# Patient Record
Sex: Female | Born: 2000 | Hispanic: Yes | Marital: Single | Smoking: Current every day smoker
Health system: Southern US, Community
[De-identification: ages and names within clinical notes are randomized; demographics above are authoritative.]

## PROBLEM LIST (undated history)

## (undated) ENCOUNTER — Emergency Department (HOSPITAL_COMMUNITY): Admission: EM | Payer: No Typology Code available for payment source

## (undated) DIAGNOSIS — B999 Unspecified infectious disease: Secondary | ICD-10-CM

## (undated) DIAGNOSIS — Z3490 Encounter for supervision of normal pregnancy, unspecified, unspecified trimester: Secondary | ICD-10-CM

## (undated) DIAGNOSIS — Z789 Other specified health status: Secondary | ICD-10-CM

## (undated) HISTORY — PX: NO PAST SURGERIES: SHX2092

---
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
## 2018-02-27 ENCOUNTER — Ambulatory Visit (HOSPITAL_COMMUNITY)
Admission: EM | Admit: 2018-02-27 | Discharge: 2018-02-27 | Disposition: A | Discharge status: Home / Self Care | Payer: Self-pay

## 2018-02-27 ENCOUNTER — Encounter (HOSPITAL_COMMUNITY): Payer: Self-pay

## 2018-02-27 ENCOUNTER — Emergency Department (HOSPITAL_COMMUNITY)
Admission: EM | Admit: 2018-02-27 | Discharge: 2018-02-27 | Disposition: A | Discharge status: Home / Self Care | Payer: Self-pay | Attending: Emergency Medicine | Admitting: Emergency Medicine

## 2018-02-27 ENCOUNTER — Other Ambulatory Visit: Payer: Self-pay

## 2018-02-27 ENCOUNTER — Emergency Department (HOSPITAL_COMMUNITY): Payer: Self-pay

## 2018-02-27 DIAGNOSIS — O26891 Other specified pregnancy related conditions, first trimester: Secondary | ICD-10-CM | POA: Insufficient documentation

## 2018-02-27 DIAGNOSIS — Z87891 Personal history of nicotine dependence: Secondary | ICD-10-CM | POA: Insufficient documentation

## 2018-02-27 DIAGNOSIS — Z3401 Encounter for supervision of normal first pregnancy, first trimester: Secondary | ICD-10-CM | POA: Insufficient documentation

## 2018-02-27 DIAGNOSIS — Z3A01 Less than 8 weeks gestation of pregnancy: Secondary | ICD-10-CM | POA: Insufficient documentation

## 2018-02-27 DIAGNOSIS — R1084 Generalized abdominal pain: Secondary | ICD-10-CM | POA: Insufficient documentation

## 2018-02-27 DIAGNOSIS — Z79899 Other long term (current) drug therapy: Secondary | ICD-10-CM | POA: Insufficient documentation

## 2018-02-27 LAB — URINALYSIS, ROUTINE W REFLEX MICROSCOPIC
Bilirubin Urine: NEGATIVE
Glucose, UA: NEGATIVE mg/dL
Hgb urine dipstick: NEGATIVE
Ketones, ur: 80 mg/dL — AB
Nitrite: NEGATIVE
Protein, ur: 30 mg/dL — AB
Specific Gravity, Urine: 1.033 — ABNORMAL HIGH (ref 1.005–1.030)
pH: 5 (ref 5.0–8.0)

## 2018-02-27 LAB — CBC WITH DIFFERENTIAL/PLATELET
Abs Immature Granulocytes: 0 10*3/uL (ref 0.0–0.1)
Basophils Absolute: 0 10*3/uL (ref 0.0–0.1)
Basophils Relative: 1 %
Eosinophils Absolute: 0 10*3/uL (ref 0.0–0.7)
Eosinophils Relative: 0 %
HCT: 44.5 % (ref 36.0–46.0)
Hemoglobin: 14.7 g/dL (ref 12.0–15.0)
Immature Granulocytes: 0 %
Lymphocytes Relative: 18 %
Lymphs Abs: 1 10*3/uL (ref 0.7–4.0)
MCH: 31.5 pg (ref 26.0–34.0)
MCHC: 33 g/dL (ref 30.0–36.0)
MCV: 95.5 fL (ref 78.0–100.0)
Monocytes Absolute: 0.5 10*3/uL (ref 0.1–1.0)
Monocytes Relative: 9 %
Neutro Abs: 3.9 10*3/uL (ref 1.7–7.7)
Neutrophils Relative %: 72 %
Platelets: 177 10*3/uL (ref 150–400)
RBC: 4.66 MIL/uL (ref 3.87–5.11)
RDW: 12.3 % (ref 11.5–15.5)
WBC: 5.4 10*3/uL (ref 4.0–10.5)

## 2018-02-27 LAB — COMPREHENSIVE METABOLIC PANEL
ALT: 14 U/L (ref 14–54)
AST: 17 U/L (ref 15–41)
Albumin: 4.4 g/dL (ref 3.5–5.0)
Alkaline Phosphatase: 50 U/L (ref 38–126)
Anion gap: 10 (ref 5–15)
BUN: 8 mg/dL (ref 6–20)
CO2: 23 mmol/L (ref 22–32)
Calcium: 9.4 mg/dL (ref 8.9–10.3)
Chloride: 105 mmol/L (ref 101–111)
Creatinine, Ser: 0.72 mg/dL (ref 0.44–1.00)
GFR calc Af Amer: >60 mL/min (ref >60)
GFR calc non Af Amer: >60 mL/min (ref >60)
Glucose, Bld: 99 mg/dL (ref 65–99)
Potassium: 3.5 mmol/L (ref 3.5–5.1)
Sodium: 138 mmol/L (ref 135–145)
Total Bilirubin: 0.8 mg/dL (ref 0.3–1.2)
Total Protein: 7.2 g/dL (ref 6.5–8.1)

## 2018-02-27 LAB — HCG, QUANTITATIVE, PREGNANCY: hCG, Beta Chain, Quant, S: 66467 m[IU]/mL — ABNORMAL HIGH (ref <5)

## 2018-02-27 LAB — LIPASE, BLOOD: Lipase: 38 U/L (ref 11–51)

## 2018-02-27 LAB — POC URINE PREG, ED: Preg Test, Ur: POSITIVE — AB

## 2018-02-27 NOTE — Discharge Instructions (Addendum)
Please read attached information. If you experience any new or worsening signs or symptoms please return to the emergency room for evaluation. Please follow-up with your primary care provider or specialist as discussed.  °

## 2018-02-27 NOTE — ED Triage Notes (Signed)
Pt states that she took a pregnancy test at home that was positive. Pt reports she is wanting to get an abortion and here "for a pill that will help".

## 2018-02-27 NOTE — ED Provider Notes (Signed)
[facility] EMERGENCY DEPARTMENT Provider Note   CSN: [id number] Arrival date & time: 02/27/18  1625     History   Chief Complaint Chief Complaint  Patient presents with  . Possible Pregnancy    HPI Kristin Bates is a 17 y.o. female.  HPI   17 year old female presents today with complaints of abdominal pain nausea vomiting and reported pregnancy.  Patient notes over the last 3 days she has had generalized abdominal pain, nonfocal with associated nausea and vomiting.  Patient notes that the symptoms feel similar to episode when she is pregnant in the past.  She notes a positive pregnancy test at home.  She denies any vaginal bleeding or discharge.  Fever.  She is able to tolerate p.o.  Past Medical History:  Diagnosis Date  . Medical history non-contributory   . Pregnant     There are no active problems to display for this patient.   Past Surgical History:  Procedure Laterality Date  . NO PAST SURGERIES       OB History    Gravida  1   Para  1   Term  1   Preterm      AB      Living  1     SAB      TAB      Ectopic      Multiple  0   Live Births  1            Home Medications    Prior to Admission medications   Medication Sig Start Date End Date Taking? Authorizing Provider  ibuprofen (ADVIL,MOTRIN) 600 MG tablet Take 1 tablet (600 mg total) by mouth every 6 (six) hours. 07/01/16   Leland Her, DO  senna-docusate (SENOKOT-S) 8.6-50 MG tablet Take 2 tablets by mouth daily. 07/02/16   Leland Her, DO    Family History Family History  Problem Relation Age of Onset  . Heart disease Father 35       Heart attack    Social History Social History   Tobacco Use  . Smoking status: Former Smoker    Last attempt to quit: 09/09/2015    Years since quitting: 2.4  . Smokeless tobacco: Never Used  Substance Use Topics  . Alcohol use: Yes    Comment: social   . Drug use: No    Types: Marijuana    Comment: Prior to pregnancy       Allergies   Patient has no known allergies.   Review of Systems Review of Systems  All other systems reviewed and are negative.   Physical Exam Updated Vital Signs BP 103/73 (BP Location: Right Arm)   Pulse 70   Temp 98.5 F (36.9 C) (Oral)   Resp 16   Ht  (1.6 m)   Wt 72.6 kg (160 lb)   LMP 01/20/2018   SpO2 99%   BMI 28.34 kg/m   Physical Exam  Constitutional: She is oriented to person, place, and time. She appears well-developed and well-nourished.  HENT:  Head: Normocephalic and atraumatic.  Eyes: Pupils are equal, round, and reactive to light. Conjunctivae are normal. Right eye exhibits no discharge. Left eye exhibits no discharge. No scleral icterus.  Neck: Normal range of motion. No JVD present. No tracheal deviation present.  Pulmonary/Chest: Effort normal. No stridor.  Abdominal: Soft. She exhibits no distension and no mass. There is tenderness. There is no rebound and no guarding. No hernia.  Minor tenderness  palpation bilateral upper quadrants, no tenderness palpation of lower abdomen or pelvis, no bounce guarding or masses  Neurological: She is alert and oriented to person, place, and time. Coordination normal.  Psychiatric: She has a normal mood and affect. Her behavior is normal. Judgment and thought content normal.  Nursing note and vitals reviewed.    ED Treatments / Results  Labs (all labs ordered are listed, but only abnormal results are displayed) Labs Reviewed  URINALYSIS, ROUTINE W REFLEX MICROSCOPIC - Abnormal; Notable for the following components:      Result Value   Color, Urine AMBER (*)    APPearance HAZY (*)    Specific Gravity, Urine 1.033 (*)    Ketones, ur 80 (*)    Protein, ur 30 (*)    Leukocytes, UA TRACE (*)    Bacteria, UA RARE (*)    All other components within normal limits  HCG, QUANTITATIVE, PREGNANCY - Abnormal; Notable for the following components:   hCG, Beta Chain, Quant, [place] 16,109 (*)    All other  components within normal limits  POC URINE PREG, ED - Abnormal; Notable for the following components:   Preg Test, Ur POSITIVE (*)    All other components within normal limits  CBC WITH DIFFERENTIAL/PLATELET  COMPREHENSIVE METABOLIC PANEL  LIPASE, BLOOD    EKG None  Radiology US Ob Comp Less 14 Wks  Result Date: 02/27/2018 CLINICAL DATA:  Pregnant patient in first-trimester pregnancy with pelvic pain. EXAM: OBSTETRIC <14 WK [place] AND TRANSVAGINAL OB US TECHNIQUE: Both transabdominal and transvaginal ultrasound examinations were performed for complete evaluation of the gestation as well as the maternal uterus, adnexal regions, and pelvic cul-de-sac. Transvaginal technique was performed to assess early pregnancy. COMPARISON:  None. FINDINGS: Intrauterine gestational sac: Single Yolk sac:  Visualized. Embryo:  Visualized. Cardiac Activity: Visualized. Heart Rate: 113 bpm CRL:  6.7 mm   6 w   4 d                  [place] EDC: 10/19/2018 Subchorionic hemorrhage:  Small measuring 1.9 x 1.2 x 1.6 cm. Maternal uterus/adnexae: Both ovaries are visualized and are normal. No adnexal mass. No pelvic free fluid. IMPRESSION: Single live intrauterine pregnancy estimated gestational age 102 weeks 4 days based on crown-rump length for estimated date of delivery 10/19/2018. Small subchorionic hemorrhage. Electronically Signed   By: Rubye Oaks M.D.   On: 02/27/2018 21:38   US Ob Transvaginal  Result Date: 02/27/2018 CLINICAL DATA:  Pregnant patient in first-trimester pregnancy with pelvic pain. EXAM: OBSTETRIC <14 WK [place] AND TRANSVAGINAL OB US TECHNIQUE: Both transabdominal and transvaginal ultrasound examinations were performed for complete evaluation of the gestation as well as the maternal uterus, adnexal regions, and pelvic cul-de-sac. Transvaginal technique was performed to assess early pregnancy. COMPARISON:  None. FINDINGS: Intrauterine gestational sac: Single Yolk sac:  Visualized. Embryo:  Visualized. Cardiac  Activity: Visualized. Heart Rate: 113 bpm CRL:  6.7 mm   6 w   4 d                  [place] EDC: 10/19/2018 Subchorionic hemorrhage:  Small measuring 1.9 x 1.2 x 1.6 cm. Maternal uterus/adnexae: Both ovaries are visualized and are normal. No adnexal mass. No pelvic free fluid. IMPRESSION: Single live intrauterine pregnancy estimated gestational age 102 weeks 4 days based on crown-rump length for estimated date of delivery 10/19/2018. Small subchorionic hemorrhage. Electronically Signed   By: Rubye Oaks M.D.   On: 02/27/2018 21:38  Procedures Procedures (including critical care time)  Medications Ordered in ED Medications - No data to display   Initial Impression / Assessment and Plan / ED Course  I have reviewed the triage vital signs and the nursing notes.  Pertinent labs & imaging results that were available during my care of the patient were reviewed by me and considered in my medical decision making (see chart for details).     Final Clinical Impressions(s) / ED Diagnoses   Final diagnoses:  Less than [redacted] weeks gestation of pregnancy   Labs: CBC, CMP, hCG, lipase, point-of-care urine pregnancy test  Imaging: Ultrasound OB transvaginal  Consults:  Therapeutics:  Discharge Meds:   Assessment/Plan: 17 year old female presents today with complaints of lower abdominal pain and pregnancy.  She notes this is similar to previous pregnancies.  She has a single live intrauterine pregnancy, small subchorionic hemorrhage noted.  Patient well-appearing in no acute distress, she has no nausea or vomiting here and does not want any medications here.  Patient is requesting assistance with abortion, she referred to the women's clinic.  She is given strict return precautions, she verbalized understanding and agreement to today's plan had no further questions or concerns      ED Discharge Orders    None       Rosalio Loud 02/28/18 2010    Eber Hong, MD 03/03/18 [phone]

## 2019-03-21 ENCOUNTER — Inpatient Hospital Stay (HOSPITAL_COMMUNITY)
Admission: AD | Admit: 2019-03-21 | Discharge: 2019-03-21 | Disposition: A | Discharge status: Home / Self Care | Payer: Self-pay | Attending: Obstetrics and Gynecology | Admitting: Obstetrics and Gynecology

## 2019-03-21 ENCOUNTER — Emergency Department (HOSPITAL_COMMUNITY)
Admission: EM | Admit: 2019-03-21 | Discharge: 2019-03-21 | Disposition: A | Discharge status: Home / Self Care | Payer: Self-pay | Attending: Emergency Medicine | Admitting: Emergency Medicine

## 2019-03-21 ENCOUNTER — Encounter (HOSPITAL_COMMUNITY): Payer: Self-pay | Admitting: Emergency Medicine

## 2019-03-21 ENCOUNTER — Other Ambulatory Visit: Payer: Self-pay

## 2019-03-21 DIAGNOSIS — F17210 Nicotine dependence, cigarettes, uncomplicated: Secondary | ICD-10-CM | POA: Insufficient documentation

## 2019-03-21 DIAGNOSIS — N760 Acute vaginitis: Secondary | ICD-10-CM | POA: Insufficient documentation

## 2019-03-21 DIAGNOSIS — Z3202 Encounter for pregnancy test, result negative: Secondary | ICD-10-CM | POA: Insufficient documentation

## 2019-03-21 DIAGNOSIS — B9689 Other specified bacterial agents as the cause of diseases classified elsewhere: Secondary | ICD-10-CM | POA: Insufficient documentation

## 2019-03-21 LAB — URINALYSIS, ROUTINE W REFLEX MICROSCOPIC
Bilirubin Urine: NEGATIVE
Glucose, UA: NEGATIVE mg/dL
Hgb urine dipstick: NEGATIVE
Ketones, ur: NEGATIVE mg/dL
Leukocytes,Ua: NEGATIVE
Nitrite: NEGATIVE
Protein, ur: NEGATIVE mg/dL
Specific Gravity, Urine: 1.009 (ref 1.005–1.030)
pH: 7 (ref 5.0–8.0)

## 2019-03-21 LAB — WET PREP, GENITAL
Sperm: NONE SEEN
Trich, Wet Prep: NONE SEEN
Yeast Wet Prep HPF POC: NONE SEEN

## 2019-03-21 LAB — POCT PREGNANCY, URINE
Preg Test, Ur: NEGATIVE
Preg Test, Ur: NEGATIVE

## 2019-03-21 MED ORDER — METRONIDAZOLE 500 MG PO TABS: 500.0000 mg | ORAL_TABLET | Freq: Two times a day (BID) | ORAL | 0 refills | Status: DC

## 2019-03-21 MED ORDER — CEFTRIAXONE SODIUM 250 MG IJ SOLR
250.0000 mg | Freq: Once | INTRAMUSCULAR | Status: AC
  Administered 2019-03-21: 250 mg via INTRAMUSCULAR
  Filled 2019-03-21: qty 250

## 2019-03-21 MED ORDER — LIDOCAINE HCL (PF) 1 % IJ SOLN
5.0000 mL | Freq: Once | INTRAMUSCULAR | Status: AC
  Administered 2019-03-21: 5 mL
  Filled 2019-03-21: qty 5

## 2019-03-21 MED ORDER — AZITHROMYCIN 250 MG PO TABS
1000.0000 mg | ORAL_TABLET | Freq: Once | ORAL | Status: AC
  Administered 2019-03-21: 1000 mg via ORAL
  Filled 2019-03-21: qty 4

## 2019-03-21 NOTE — MAU Provider Note (Signed)
First Provider Initiated Contact with Patient 03/21/19 1843      S Ms. Marguarite Markov is a 18 y.o. G53P1001 non-pregnant female who presents to MAU today with complaint of possible vaginal laceration.   O BP 113/70 (BP Location: Right Arm)   Pulse 82   Temp 98.2 F (36.8 C)   Resp 16   Wt 69.5 kg   LMP 02/16/2019   SpO2 100%   BMI 27.12 kg/m  Physical Exam  Constitutional: She is oriented to person, place, and time. She appears well-developed and well-nourished. No distress.  HENT:  Head: Normocephalic and atraumatic.  Respiratory: Effort normal.  Neurological: She is alert and oriented to person, place, and time.  Skin: She is not diaphoretic.  Psychiatric: She has a normal mood and affect. Her behavior is normal. Judgment and thought content normal.   A Non pregnant female Medical screening exam complete  P Discharge from MAU in stable condition Patient given the option of transfer to [facility] for further evaluation or seek care in outpatient facility of choice List of options for follow-up given  Warning signs for worsening condition that would warrant emergency follow-up discussed Patient may return to MAU as needed for pregnancy related complaints  Nugent, Gerrie Nordmann, NP 03/21/2019 6:48 PM

## 2019-03-21 NOTE — ED Provider Notes (Signed)
[facility] EMERGENCY DEPARTMENT Provider Note   CSN: [id number] Arrival date & time: 03/21/19  1853    History   Chief Complaint Chief Complaint  Patient presents with  . Vaginal Pain    HPI Kristin Bates is a 18 y.o. female, G1, 45P1, presenting to the emergency department from the MAU with complaint of vaginal pain that began on Sunday.  Patient states she has intermittent pain that is sometimes worse with movement.  She states she initially had pain with urination, however has not had recurrence of this.  She had a little bit of spotting with wiping after urinating, however no frank vaginal bleeding.  She states the pain feels similar to the pain associated with a vaginal tear during childbirth.  LMP 02/16/2019, states she is due for her period soon.  She does endorse vaginal discharge.  She is sexually active with one female partner without protection.  She had pregnancy test done at MAU today which was negative. No assoc abdominal pain.     The history is provided by the patient and medical records.    Past Medical History:  Diagnosis Date  . Medical history non-contributory   . Pregnant     There are no active problems to display for this patient.   Past Surgical History:  Procedure Laterality Date  . NO PAST SURGERIES       OB History    Gravida  1   Para  1   Term  1   Preterm      AB      Living  1     SAB      TAB      Ectopic      Multiple  0   Live Births  1            Home Medications    Prior to Admission medications   Medication Sig Start Date End Date Taking? Authorizing Provider  ibuprofen (ADVIL,MOTRIN) 600 MG tablet Take 1 tablet (600 mg total) by mouth every 6 (six) hours. 07/01/16   Leland HerYoo, Elsia J, DO  senna-docusate (SENOKOT-S) 8.6-50 MG tablet Take 2 tablets by mouth daily. 07/02/16   Leland HerYoo, Elsia J, DO    Family History Family History  Problem Relation Age of Onset  . Heart disease Father 4438       Heart  attack    Social History Social History   Tobacco Use  . Smoking status: Former Smoker    Quit date: 09/09/2015    Years since quitting: 3.5  . Smokeless tobacco: Never Used  Substance Use Topics  . Alcohol use: Yes    Comment: social   . Drug use: No    Types: Marijuana    Comment: Prior to pregnancy      Allergies   Patient has no known allergies.   Review of Systems Review of Systems  Constitutional: Negative for fever.  Gastrointestinal: Negative for abdominal pain.  Genitourinary: Positive for vaginal bleeding, vaginal discharge and vaginal pain. Negative for dysuria and frequency.  All other systems reviewed and are negative.    Physical Exam Updated Vital Signs BP 101/72   Pulse 75   Temp 98.2 F (36.8 C) (Oral)   Resp 18   LMP 02/16/2019 (Exact Date)   SpO2 100%   Physical Exam Vitals signs and nursing note reviewed. Exam conducted with a chaperone present.  Constitutional:      General: She is not in acute distress.  Appearance: She is well-developed.  HENT:     Head: Normocephalic and atraumatic.  Eyes:     Conjunctiva/sclera: Conjunctivae normal.  Cardiovascular:     Rate and Rhythm: Normal rate.  Pulmonary:     Effort: Pulmonary effort is normal.  Abdominal:     General: Bowel sounds are normal.     Palpations: Abdomen is soft.     Tenderness: There is no abdominal tenderness.  Genitourinary:    General: Normal vulva.     Pubic Area: No rash.      Labia:        Right: No rash or tenderness.        Left: No rash or tenderness.      Urethra: No urethral swelling.     Cervix: No cervical bleeding.     Uterus: Normal. Not enlarged and not tender.      Adnexa: Right adnexa normal and left adnexa normal.       Right: No mass or tenderness.         Left: No mass or tenderness.       Comments: Yellow/grayish discharge present.  There is some vaginal erythema, however no obvious laceration.  There is tenderness inside of the vagina just  deep to the urethra on the anterior vaginal wall.  No obvious abnormality on palpation.  No CMT or adnexal tenderness. Skin:    General: Skin is warm.  Neurological:     Mental Status: She is alert.  Psychiatric:        Behavior: Behavior normal.      ED Treatments / Results  Labs (all labs ordered are listed, but only abnormal results are displayed) Labs Reviewed  WET PREP, GENITAL - Abnormal; Notable for the following components:      Result Value   Clue Cells Wet Prep HPF POC PRESENT (*)    WBC, Wet Prep HPF POC MANY (*)    All other components within normal limits  URINALYSIS, ROUTINE W REFLEX MICROSCOPIC  HIV ANTIBODY (ROUTINE TESTING W REFLEX)  RPR  GC/CHLAMYDIA PROBE AMP ([place]) NOT AT [facility]    EKG None  Radiology No results found.  Procedures Procedures (including critical care time)  Medications Ordered in ED Medications  cefTRIAXone (ROCEPHIN) injection 250 mg (has no administration in time range)  azithromycin (ZITHROMAX) tablet 1,000 mg (has no administration in time range)  lidocaine (PF) (XYLOCAINE) 1 % injection 5 mL (has no administration in time range)     Initial Impression / Assessment and Plan / ED Course  I have reviewed the triage vital signs and the nursing notes.  Pertinent labs & imaging results that were available during my care of the patient were reviewed by me and considered in my medical decision making (see chart for details).        Patient presenting with vaginal pain intermittently since Sunday.  Patient had a small of spotting, however is due for her period soon.  Negative pregnancy test at MAU today.  She is currently sexually active with female partner without protection.  Endorses some discharge.  No urinary symptoms except for one episode of dysuria beginning of the week.  On exam, abdomen is benign.  Pelvic reveals abnormal discharge with some localized tenderness to the anterior vaginal wall, however no obvious laceration  is visualized.  Wet prep with many white cells and clue cells.  STD cultures are sent.  Given patient's exam, will treat with Flagyl for BV, and treat prophylactically with  azithromycin and Rocephin.  No suspicion for PID, no CMT or adnexal tenderness, no abdominal complaints or systemic complaints.  Vital signs are stable, afebrile.  Patient is aware she is pending STD cultures and instructed to avoid any intercourse until she knows all of her results.  GYN referral provided for follow-up.  Safe for discharge.  Discussed results, findings, treatment and follow up. Patient advised of return precautions. Patient verbalized understanding and agreed with plan.   Final Clinical Impressions(s) / ED Diagnoses   Final diagnoses:  BV (bacterial vaginosis)    ED Discharge Orders    None       Baily Serpe, [place] N, PA-C 03/21/19 2157    Little, Ambrose Finlandachel Morgan, MD 03/22/19 0008

## 2019-03-21 NOTE — MAU Note (Addendum)
Pt states that she feels like she has a vaginal tear, but is not having any bleeding.   Pt reports reports vaginal pain with movement.   Pt reports some vaginal bleeding 3 days ago.   Pt also reports burning with urination for 4-5 days but stopped today.

## 2019-03-21 NOTE — Discharge Instructions (Signed)
Please read the instructions below.  Talk with your primary care provider about any new medications.  Please schedule an appointment for follow up with your OBGYN or primary care.  You have been treated today for gonorrhea and chlamydia. Finish your antibiotic (Flagyl/Metronidazole) as prescribed. Do not drink alcohol with this medication as it will cause vomiting.  You will receive a call from the hospital if your test results come back positive. Avoid all sexual activity until you know your test results. If your results come back positive, it is important that you inform all of your sexual partners.  Return to the ER for high fever, severe abdominal pain, or new or worsening symptoms.

## 2019-03-21 NOTE — ED Triage Notes (Signed)
Patient reports sudden onset of vaginal bleeding that only lasted for a few minutes on Sunday, followed by pelvic pain that has persisted. LMP 02/16/19 - sent over from [facility] center because of a negative pregnancy test.

## 2019-03-22 LAB — RPR: RPR Ser Ql: NONREACTIVE

## 2019-03-22 LAB — HIV ANTIBODY (ROUTINE TESTING W REFLEX): HIV Screen 4th Generation wRfx: NONREACTIVE

## 2019-03-22 LAB — GC/CHLAMYDIA PROBE AMP (~~LOC~~) NOT AT ARMC
Chlamydia: POSITIVE — AB
Neisseria Gonorrhea: NEGATIVE

## 2019-04-19 ENCOUNTER — Other Ambulatory Visit: Payer: Self-pay

## 2019-04-19 ENCOUNTER — Emergency Department (HOSPITAL_COMMUNITY): Payer: Self-pay

## 2019-04-19 ENCOUNTER — Emergency Department (HOSPITAL_COMMUNITY)
Admission: EM | Admit: 2019-04-19 | Discharge: 2019-04-19 | Disposition: A | Discharge status: Home / Self Care | Payer: Self-pay | Attending: Emergency Medicine | Admitting: Emergency Medicine

## 2019-04-19 DIAGNOSIS — Y929 Unspecified place or not applicable: Secondary | ICD-10-CM | POA: Insufficient documentation

## 2019-04-19 DIAGNOSIS — M25571 Pain in right ankle and joints of right foot: Secondary | ICD-10-CM | POA: Insufficient documentation

## 2019-04-19 DIAGNOSIS — Y9301 Activity, walking, marching and hiking: Secondary | ICD-10-CM | POA: Insufficient documentation

## 2019-04-19 DIAGNOSIS — Z79899 Other long term (current) drug therapy: Secondary | ICD-10-CM | POA: Insufficient documentation

## 2019-04-19 DIAGNOSIS — Z87891 Personal history of nicotine dependence: Secondary | ICD-10-CM | POA: Insufficient documentation

## 2019-04-19 DIAGNOSIS — Y999 Unspecified external cause status: Secondary | ICD-10-CM | POA: Insufficient documentation

## 2019-04-19 DIAGNOSIS — S80211A Abrasion, right knee, initial encounter: Secondary | ICD-10-CM | POA: Insufficient documentation

## 2019-04-19 DIAGNOSIS — W010XXA Fall on same level from slipping, tripping and stumbling without subsequent striking against object, initial encounter: Secondary | ICD-10-CM | POA: Insufficient documentation

## 2019-04-19 DIAGNOSIS — S80212A Abrasion, left knee, initial encounter: Secondary | ICD-10-CM | POA: Insufficient documentation

## 2019-04-19 MED ORDER — DOUBLE ANTIBIOTIC 500-10000 UNIT/GM EX OINT
TOPICAL_OINTMENT | Freq: Two times a day (BID) | CUTANEOUS | Status: DC
  Filled 2019-04-19: qty 28.4

## 2019-04-19 MED ORDER — HYDROCODONE-ACETAMINOPHEN 5-325 MG PO TABS
1.0000 | ORAL_TABLET | Freq: Once | ORAL | Status: AC
  Administered 2019-04-19: 1 via ORAL
  Filled 2019-04-19: qty 1

## 2019-04-19 NOTE — Progress Notes (Signed)
Orthopedic Tech Progress Note Patient Details:  Kristin Bates 12/02/2000 [id number]  Ortho Devices Type of Ortho Device: Ace wrap, Crutches Ortho Device/Splint Interventions: Application   Post Interventions Patient Tolerated: Well Instructions Provided: Care of device   [place] Pink 04/19/2019, 9:51 AM

## 2019-04-19 NOTE — ED Triage Notes (Signed)
Pt fell last night and twisted her right ankle. Abrasions to both knees. Pt did not hit her head or LOC

## 2019-04-19 NOTE — ED Notes (Signed)
Patient transported to X-ray 

## 2019-04-19 NOTE — ED Provider Notes (Signed)
[facility]   CSN: [id number] Arrival date & time: 04/19/19  0807    History   Chief Complaint Chief Complaint  Patient presents with  . Ankle Pain    HPI Kristin Bates is a 18 y.o. female who presents today for evaluation of right ankle and bilateral knee pain.  She reports that last night she was wearing different shoes when she misstepped causing her to roll her right ankle.  She reports immediate onset of pain in the right ankle.  She denies striking her head or passing out.  She states that she scraped both of her knees.  She denies any other injuries.  She denies possibility of pregnancy.  She reports significant pain when she attempts to bear weight on the ankle.  No interventions tried prior to arrival.  She does report that she cleaned both of her knees with Betadine last night.  She denies any possibility of pregnancy.     HPI  Past Medical History:  Diagnosis Date  . Medical history non-contributory   . Pregnant     There are no active problems to display for this patient.   Past Surgical History:  Procedure Laterality Date  . NO PAST SURGERIES       OB History    Gravida  1   Para  1   Term  1   Preterm      AB      Living  1     SAB      TAB      Ectopic      Multiple  0   Live Births  1            Home Medications    Prior to Admission medications   Medication Sig Start Date End Date Taking? Authorizing Provider  ibuprofen (ADVIL,MOTRIN) 600 MG tablet Take 1 tablet (600 mg total) by mouth every 6 (six) hours. 07/01/16   Leland HerYoo, Elsia J, DO  metroNIDAZOLE (FLAGYL) 500 MG tablet Take 1 tablet (500 mg total) by mouth 2 (two) times daily. 03/21/19   Robinson, [place] N, PA-C  senna-docusate (SENOKOT-S) 8.6-50 MG tablet Take 2 tablets by mouth daily. 07/02/16   Leland HerYoo, Elsia J, DO    Family History Family History  Problem Relation Age of Onset  . Heart disease Father 4238       Heart attack     Social History Social History   Tobacco Use  . Smoking status: Former Smoker    Quit date: 09/09/2015    Years since quitting: 3.6  . Smokeless tobacco: Never Used  Substance Use Topics  . Alcohol use: Yes    Comment: social   . Drug use: No    Types: Marijuana    Comment: Prior to pregnancy      Allergies   Patient has no known allergies.   Review of Systems Review of Systems  Constitutional: Negative for chills and fever.  Musculoskeletal:       Right ankle pain and swelling  Skin: Positive for wound (Abrasions to bilateral anterior knees).  Neurological: Negative for syncope and headaches.  All other systems reviewed and are negative.    Physical Exam Updated Vital Signs BP 103/61 (BP Location: Right Arm)   Pulse 85   Temp 98.6 F (37 C) (Oral)   Resp 16   LMP 04/12/2019 (Exact Date)   SpO2 100%   Physical Exam Vitals signs and nursing note reviewed.  Constitutional:      General: She is not in acute distress.    Appearance: She is not ill-appearing.  HENT:     Head: Normocephalic.  Cardiovascular:     Rate and Rhythm: Normal rate.     Pulses: Normal pulses.     Comments: 2+ DP pulses bilaterally.  Right PT pulses not palpated secondary to edema and pain. Pulmonary:     Effort: Pulmonary effort is normal. No respiratory distress.  Musculoskeletal:     Comments: There is significant edema along the lateral aspect of the right ankle.  No crepitus palpated.  The right ankle is significantly tender to palpation primarily along the lateral aspect along with when pressure is applied to the bottom of the foot.  There is no tenderness to palpation along the foot, rather referred pain into the ankle.  No tenderness to palpation over proximal right lower leg.  Bilateral knees have full range of motion with mild pain during flexion along the abrasions anteriorly.  Skin:    Comments: There are abrasions to the anterior bilateral knees without evidence of  surrounding erythema, fluctuance, induration or secondary infection.  Mild serous drainage from abrasions bilaterally.    Neurological:     Mental Status: She is alert.      ED Treatments / Results  Labs (all labs ordered are listed, but only abnormal results are displayed) Labs Reviewed - No data to display  EKG None  Radiology Dg Ankle Complete Right  Result Date: 04/19/2019 CLINICAL DATA:  Pain and swelling following fall EXAM: RIGHT ANKLE - COMPLETE 3+ VIEW COMPARISON:  None. FINDINGS: Frontal, oblique, and lateral views were obtained. There is soft tissue swelling laterally. There is no demonstrable fracture or joint effusion. There is no joint space narrowing or erosion. Ankle mortise appears intact. IMPRESSION: Soft tissue swelling laterally. No evident fracture or arthropathic change. Ankle mortise appears intact. Electronically Signed   By: Bretta BangWilliam  Woodruff III M.D.   On: 04/19/2019 08:45    Procedures Procedures (including critical care time)  Medications Ordered in ED Medications  polymixin-bacitracin (POLYSPORIN) ointment (has no administration in time range)  HYDROcodone-acetaminophen (NORCO/VICODIN) 5-325 MG per tablet 1 tablet (has no administration in time range)     Initial Impression / Assessment and Plan / ED Course  I have reviewed the triage vital signs and the nursing notes.  Pertinent labs & imaging results that were available during my care of the patient were reviewed by me and considered in my medical decision making (see chart for details).       Kristin Bates Presents with right ankle pain after a mechanical trip causing her to roll her right ankle consistent with an ankle sprain/strain.  The affected ankle has mild edema and is tender on the lateral aspect.  X-rays were obtained with out acute abnormality. The skin is intact to ankle/foot.  The foot is warm and well perfused with intact sensation.  Motor function is limited secondary to pain.   Patient given instructions for OTC pain medication, she is given a one-time dose of Vicodin while in the emergency room based on the extent of the swelling, Ace wrap, and crutches.  She is given orthopedics follow-up and instructed to follow-up if symptoms do not significantly improve in 1 week.  Stressed the importance of ice and aggressive elevation to help with her pain.   Recommended topical antibiotic ointment of her abrasions on bilateral knees.  General wound care was discussed.  Return precautions were  discussed with patient who states their understanding.  At the time of discharge patient denied any unaddressed complaints or concerns.  Patient is agreeable for discharge home.   Final Clinical Impressions(s) / ED Diagnoses   Final diagnoses:  Acute right ankle pain  Abrasion of knee, bilateral    ED Discharge Orders    None       Ollen Gross 04/19/19 5366    Isla Pence, MD 04/19/19 1000

## 2019-04-19 NOTE — Discharge Instructions (Addendum)
I have given you the information for an orthopedics (bone) doctor.  If your symptoms do not improve significantly in 1 week or you feel you need to be seen again for your ankle please call them and make an appointment.  Your x-rays today did not show any break in the ankle, and as we discussed I suspect you have an ankle sprain/strain.  You are given a dose of Vicodin while in the emergency room.  This may make you drowsy and sleepy, please do not drive, operate heavy machinery, or perform any other tasks that may be dangerous to you or anyone else if you were to make a slow, poor decision or fall asleep.  Please follow these precautions for at least the next 12 hours.  It is very important that you elevate your ankle above your heart.  Please also do ice 20 minutes on 20 minutes off.  The more aggressive you are in the elevation and icing the sooner the pain, especially the throbbing pain, will improve.  Please take Ibuprofen (Advil, motrin) and Tylenol (acetaminophen) to relieve your pain.  You may take up to 600 MG (3 pills) of normal strength ibuprofen every 8 hours as needed.  In between doses of ibuprofen you make take tylenol, up to 1,000 mg (two extra strength pills).  Do not take more than 3,000 mg tylenol in a 24 hour period.  Please check all medication labels as many medications such as pain and cold medications may contain tylenol.  Do not drink alcohol while taking these medications.  Do not take other NSAID'S while taking ibuprofen (such as aleve or naproxen).  Please take ibuprofen with food to decrease stomach upset.

## 2019-07-09 ENCOUNTER — Other Ambulatory Visit: Payer: Self-pay

## 2019-07-09 ENCOUNTER — Encounter (HOSPITAL_COMMUNITY): Payer: Self-pay | Admitting: *Deleted

## 2019-07-09 ENCOUNTER — Inpatient Hospital Stay (HOSPITAL_COMMUNITY)
Admission: AD | Admit: 2019-07-09 | Discharge: 2019-07-09 | Disposition: A | Discharge status: Home / Self Care | Payer: Self-pay | Attending: Obstetrics & Gynecology | Admitting: Obstetrics & Gynecology

## 2019-07-09 DIAGNOSIS — R300 Dysuria: Secondary | ICD-10-CM | POA: Insufficient documentation

## 2019-07-09 DIAGNOSIS — N898 Other specified noninflammatory disorders of vagina: Secondary | ICD-10-CM | POA: Insufficient documentation

## 2019-07-09 DIAGNOSIS — R1030 Lower abdominal pain, unspecified: Secondary | ICD-10-CM | POA: Insufficient documentation

## 2019-07-09 DIAGNOSIS — R109 Unspecified abdominal pain: Secondary | ICD-10-CM

## 2019-07-09 LAB — POCT PREGNANCY, URINE: Preg Test, Ur: NEGATIVE

## 2019-07-09 NOTE — MAU Note (Signed)
Feeling pain in lower abd, cramping.  Started about a wk ago. Hurts when she urinates. D/c has an odor. Has not done a HPT>

## 2019-07-09 NOTE — MAU Provider Note (Signed)
First Provider Initiated Contact with Patient 07/09/19 1208     S Ms. Kristin Bates is a 18 y.o. G12P1001 non-pregnant female who presents to MAU today with complaint of lower abdominal cramping. She endorses dysuria and abnormal vaginal discharge. She reports LMP in early August. She has not taken a HPT.  O BP (!) 102/56 (BP Location: Right Arm)   Pulse (!) 105   Temp 98.4 F (36.9 C) (Oral)   Resp 18   Ht 5\' 4"  (1.626 m)   Wt 70.2 kg   LMP 05/16/2019 (Approximate)   SpO2 98%   BMI 26.57 kg/m  Physical Exam  Nursing note and vitals reviewed. Constitutional: She is oriented to person, place, and time. She appears well-developed and well-nourished.  Cardiovascular: Normal rate.  Respiratory: Effort normal.  GI: Soft.  Neurological: She is alert and oriented to person, place, and time.  Skin: Skin is warm and dry.  Psychiatric: She has a normal mood and affect. Her behavior is normal. Judgment and thought content normal.    A Non pregnant female Medical screening exam complete  P Discharge from MAU in stable condition Patient given the option of transfer to [facility] for further evaluation or seek care in outpatient facility of choice List of options for follow-up given  Warning signs for worsening condition that would warrant emergency follow-up discussed Patient may return to MAU as needed for pregnancy related complaints  Mallie Snooks, CNM 07/09/2019 12:21 PM

## 2019-07-22 ENCOUNTER — Emergency Department (HOSPITAL_COMMUNITY)
Admission: EM | Admit: 2019-07-22 | Discharge: 2019-07-23 | Disposition: A | Discharge status: Home / Self Care | Payer: Self-pay | Attending: Emergency Medicine | Admitting: Emergency Medicine

## 2019-07-22 ENCOUNTER — Other Ambulatory Visit: Payer: Self-pay

## 2019-07-22 ENCOUNTER — Encounter (HOSPITAL_COMMUNITY): Payer: Self-pay | Admitting: Emergency Medicine

## 2019-07-22 DIAGNOSIS — B349 Viral infection, unspecified: Secondary | ICD-10-CM | POA: Insufficient documentation

## 2019-07-22 DIAGNOSIS — Z87891 Personal history of nicotine dependence: Secondary | ICD-10-CM | POA: Insufficient documentation

## 2019-07-22 LAB — GROUP A STREP BY PCR: Group A Strep by PCR: NOT DETECTED

## 2019-07-22 NOTE — ED Triage Notes (Signed)
Pt c/o sore throat x 3 days. No known COVID exposure.

## 2019-07-23 MED ORDER — PREDNISONE 10 MG (21) PO TBPK: ORAL_TABLET | ORAL | 0 refills | Status: DC

## 2019-07-23 MED ORDER — BENZONATATE 100 MG PO CAPS: 100.0000 mg | ORAL_CAPSULE | Freq: Three times a day (TID) | ORAL | 0 refills | Status: DC

## 2019-07-23 MED ORDER — FLUTICASONE PROPIONATE 50 MCG/ACT NA SUSP: 2.0000 | Freq: Every day | NASAL | 2 refills | Status: DC

## 2019-07-23 NOTE — ED Provider Notes (Signed)
[facility] EMERGENCY DEPARTMENT Provider Note   CSN: [id number] Arrival date & time: 07/22/19  2114     History   Chief Complaint Chief Complaint  Patient presents with  . Sore Throat    HPI Kristin Bates is a 18 y.o. female.     HPI   Kristin Bates is a 18 y.o. female, patient with no pertinent past medical history, presenting to the ED with nasal congestion, rhinorrhea, sore throat, and cough for the last 3 to 4 days.  Has not tried any therapies. LMP October 6. Patient denies contact with known or suspected COVID-19 patients. Denies fever, shortness of breath, chest pain, N/V/D, abdominal pain, syncope, or any other complaints.    Past Medical History:  Diagnosis Date  . Medical history non-contributory   . Pregnant     There are no active problems to display for this patient.   Past Surgical History:  Procedure Laterality Date  . NO PAST SURGERIES       OB History    Gravida  1   Para  1   Term  1   Preterm      AB      Living  1     SAB      TAB      Ectopic      Multiple  0   Live Births  1            Home Medications    Prior to Admission medications   Medication Sig Start Date End Date Taking? Authorizing Provider  benzonatate (TESSALON) 100 MG capsule Take 1 capsule (100 mg total) by mouth every 8 (eight) hours. 07/23/19   Camari Quintanilla C, PA-C  fluticasone (FLONASE) 50 MCG/ACT nasal spray Place 2 sprays into both nostrils daily. 07/23/19   Samera Macy C, PA-C  ibuprofen (ADVIL,MOTRIN) 600 MG tablet Take 1 tablet (600 mg total) by mouth every 6 (six) hours. 07/01/16   Bufford Lope, DO  metroNIDAZOLE (FLAGYL) 500 MG tablet Take 1 tablet (500 mg total) by mouth 2 (two) times daily. 03/21/19   Robinson, [place] N, PA-C  predniSONE (STERAPRED UNI-PAK 21 TAB) 10 MG (21) TBPK tablet Take 6 tabs (60mg ) day 1, 5 tabs (50mg ) day 2, 4 tabs (40mg ) day 3, 3 tabs (30mg ) day 4, 2 tabs (20mg ) day 5, and 1 tab (10mg ) day 6. 07/23/19    Lamar Naef C, PA-C  senna-docusate (SENOKOT-S) 8.6-50 MG tablet Take 2 tablets by mouth daily. 07/02/16   Bufford Lope, DO    Family History Family History  Problem Relation Age of Onset  . Heart disease Father 35       Heart attack    Social History Social History   Tobacco Use  . Smoking status: Former Smoker    Quit date: 09/09/2015    Years since quitting: 3.8  . Smokeless tobacco: Never Used  Substance Use Topics  . Alcohol use: Yes    Comment: social   . Drug use: No    Types: Marijuana    Comment: Prior to pregnancy      Allergies   Patient has no known allergies.   Review of Systems Review of Systems  Constitutional: Negative for diaphoresis and fever.  HENT: Positive for congestion, rhinorrhea, sneezing and sore throat. Negative for ear pain, sinus pressure, sinus pain, trouble swallowing and voice change.   Respiratory: Positive for cough. Negative for shortness of breath.   Cardiovascular: Negative for chest  pain.  Gastrointestinal: Negative for abdominal pain, diarrhea, nausea and vomiting.  Neurological: Negative for syncope and weakness.  All other systems reviewed and are negative.    Physical Exam Updated Vital Signs BP 113/70 (BP Location: Left Arm)   Pulse 70   Temp 98.6 F (37 C) (Oral)   Resp 18   SpO2 99%   Physical Exam Vitals signs and nursing note reviewed.  Constitutional:      General: She is not in acute distress.    Appearance: She is well-developed. She is not diaphoretic.  HENT:     Head: Normocephalic and atraumatic.     Nose: Mucosal edema, congestion and rhinorrhea present. Rhinorrhea is clear.     Right Sinus: No maxillary sinus tenderness or frontal sinus tenderness.     Left Sinus: No maxillary sinus tenderness or frontal sinus tenderness.     Mouth/Throat:     Mouth: Mucous membranes are moist.     Pharynx: Oropharynx is clear. Uvula midline. Posterior oropharyngeal erythema present. No pharyngeal swelling,  oropharyngeal exudate or uvula swelling.     Tonsils: No tonsillar abscesses.  Eyes:     Conjunctiva/sclera: Conjunctivae normal.  Neck:     Musculoskeletal: Neck supple.  Cardiovascular:     Rate and Rhythm: Normal rate and regular rhythm.     Pulses: Normal pulses.     Heart sounds: Normal heart sounds.  Pulmonary:     Effort: Pulmonary effort is normal. No respiratory distress.     Breath sounds: Normal breath sounds.  Abdominal:     Palpations: Abdomen is soft.     Tenderness: There is no abdominal tenderness. There is no guarding.  Lymphadenopathy:     Cervical: No cervical adenopathy.  Skin:    General: Skin is warm and dry.  Neurological:     Mental Status: She is alert.  Psychiatric:        Mood and Affect: Mood and affect normal.        Speech: Speech normal.        Behavior: Behavior normal.      ED Treatments / Results  Labs (all labs ordered are listed, but only abnormal results are displayed) Labs Reviewed  GROUP A STREP BY PCR    EKG None  Radiology No results found.  Procedures Procedures (including critical care time)  Medications Ordered in ED Medications - No data to display   Initial Impression / Assessment and Plan / ED Course  I have reviewed the triage vital signs and the nursing notes.  Pertinent labs & imaging results that were available during my care of the patient were reviewed by me and considered in my medical decision making (see chart for details).        Patient presents with symptoms suggestive of viral syndrome. Patient is nontoxic appearing, afebrile, not tachycardic, not tachypneic, not hypotensive, maintains excellent SPO2 on room air, and is in no apparent distress.  The patient was given instructions for home care as well as return precautions. Patient voices understanding of these instructions, accepts the plan, and is comfortable with discharge.  Final Clinical Impressions(s) / ED Diagnoses   Final diagnoses:   Viral syndrome    ED Discharge Orders         Ordered    benzonatate (TESSALON) 100 MG capsule  Every 8 hours     07/23/19 0248    fluticasone (FLONASE) 50 MCG/ACT nasal spray  Daily     07/23/19 0248  predniSONE (STERAPRED UNI-PAK 21 TAB) 10 MG (21) TBPK tablet     07/23/19 0248           Anselm PancoastJoy, Abbigail Anstey C, PA-C 07/23/19 0317    Dione BoozeGlick, David, MD 07/23/19 [phone]

## 2019-07-23 NOTE — Discharge Instructions (Addendum)
Your symptoms are likely consistent with a viral illness. Viruses do not require or respond to antibiotics. Treatment is symptomatic care and it is important to note that these symptoms may last for 7-14 days.   Hand washing: Wash your hands throughout the day, but especially before and after touching the face, using the restroom, sneezing, coughing, or touching surfaces that have been coughed or sneezed upon. Hydration: Symptoms of most illnesses will be intensified and complicated by dehydration. Dehydration can also extend the duration of symptoms. Drink plenty of fluids and get plenty of rest. You should be drinking at least half a liter of water an hour to stay hydrated. Electrolyte drinks (ex. Gatorade, Powerade, Pedialyte) are also encouraged. You should be drinking enough fluids to make your urine light yellow, almost clear. If this is not the case, you are not drinking enough water. Please note that some of the treatments indicated below will not be effective if you are not adequately hydrated. Diet: Please concentrate on hydration, however, you may introduce food slowly.  Start with a clear liquid diet, progressed to a full liquid diet, and then bland solids as you are able. Pain or fever: Ibuprofen, Naproxen, or acetaminophen (generic for Tylenol) for pain or fever.  Antiinflammatory medications: Take 600 mg of ibuprofen every 6 hours or 440 mg (over the counter dose) to 500 mg (prescription dose) of naproxen every 12 hours for the next 3 days. After this time, these medications may be used as needed for pain. Take these medications with food to avoid upset stomach. Choose only one of these medications, do not take them together. Acetaminophen (generic for Tylenol): Should you continue to have additional pain while taking the ibuprofen or naproxen, you may add in acetaminophen as needed. Your daily total maximum amount of acetaminophen from all sources should be limited to 4000mg /day for persons  without liver problems, or 2000mg /day for those with liver problems. Cough: Use the benzonatate (generic for Tessalon) for cough.  Teas, warm liquids, broths, and honey can also help with cough. Prednisone: Take the prednisone, as directed, in its entirety. Zyrtec or Claritin: May add these medication daily to control underlying symptoms of congestion, sneezing, and other signs of allergies.  These medications are available over-the-counter. Generics: Cetirizine (generic for Zyrtec) and loratadine (generic for Claritin). Fluticasone: Use fluticasone (generic for Flonase), as directed, for nasal and sinus congestion.  This medication is available over-the-counter. Congestion: Plain guaifenesin (generic for plain Mucinex) may help relieve congestion. Saline sinus rinses and saline nasal sprays may also help relieve congestion. If you do not have high blood pressure, heart problems, or an allergy to such medications, you may also try phenylephrine or Sudafed. Sore throat: Warm liquids or Chloraseptic spray may help soothe a sore throat. Gargle twice a day with a salt water solution made from a half teaspoon of salt in a cup of warm water.  Follow up: Follow up with a primary care provider within the next two weeks should symptoms fail to resolve. Return: Return to the ED for significantly worsening symptoms, shortness of breath, persistent vomiting, large amounts of blood in stool, or any other major concerns.  For prescription assistance, may try using prescription discount sites or apps, such as goodrx.com

## 2020-02-02 ENCOUNTER — Other Ambulatory Visit: Payer: Self-pay

## 2020-02-02 ENCOUNTER — Emergency Department (HOSPITAL_COMMUNITY)
Admission: EM | Admit: 2020-02-02 | Discharge: 2020-02-02 | Discharge status: Against Medical Advice | Payer: Self-pay | Attending: Emergency Medicine | Admitting: Emergency Medicine

## 2020-02-02 DIAGNOSIS — M546 Pain in thoracic spine: Secondary | ICD-10-CM | POA: Diagnosis present

## 2020-02-02 DIAGNOSIS — Z5321 Procedure and treatment not carried out due to patient leaving prior to being seen by health care provider: Secondary | ICD-10-CM | POA: Insufficient documentation

## 2020-02-02 NOTE — ED Notes (Signed)
Pt called for H19 , no answer in waiting room

## 2020-02-02 NOTE — ED Notes (Signed)
Pt called for examination; no response; pt discharged AMA
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
## 2020-05-25 ENCOUNTER — Other Ambulatory Visit: Payer: Self-pay

## 2020-05-25 ENCOUNTER — Inpatient Hospital Stay (HOSPITAL_COMMUNITY)
Admission: AD | Admit: 2020-05-25 | Discharge: 2020-05-25 | Disposition: A | Discharge status: Home / Self Care | Payer: Self-pay | Attending: Obstetrics and Gynecology | Admitting: Obstetrics and Gynecology

## 2020-05-25 ENCOUNTER — Encounter (HOSPITAL_COMMUNITY): Payer: Self-pay | Admitting: Obstetrics and Gynecology

## 2020-05-25 DIAGNOSIS — Z79899 Other long term (current) drug therapy: Secondary | ICD-10-CM | POA: Insufficient documentation

## 2020-05-25 DIAGNOSIS — Z3A08 8 weeks gestation of pregnancy: Secondary | ICD-10-CM | POA: Insufficient documentation

## 2020-05-25 DIAGNOSIS — F1290 Cannabis use, unspecified, uncomplicated: Secondary | ICD-10-CM | POA: Insufficient documentation

## 2020-05-25 DIAGNOSIS — O99321 Drug use complicating pregnancy, first trimester: Secondary | ICD-10-CM | POA: Insufficient documentation

## 2020-05-25 DIAGNOSIS — O219 Vomiting of pregnancy, unspecified: Secondary | ICD-10-CM | POA: Insufficient documentation

## 2020-05-25 DIAGNOSIS — Z87891 Personal history of nicotine dependence: Secondary | ICD-10-CM | POA: Insufficient documentation

## 2020-05-25 LAB — URINALYSIS, ROUTINE W REFLEX MICROSCOPIC
Bilirubin Urine: NEGATIVE
Glucose, UA: NEGATIVE mg/dL
Hgb urine dipstick: NEGATIVE
Ketones, ur: NEGATIVE mg/dL
Nitrite: POSITIVE — AB
Protein, ur: NEGATIVE mg/dL
Specific Gravity, Urine: 1.005 (ref 1.005–1.030)
Squamous Epithelial / LPF: >50 — ABNORMAL HIGH (ref 0–5)
pH: 7 (ref 5.0–8.0)

## 2020-05-25 LAB — POCT PREGNANCY, URINE: Preg Test, Ur: POSITIVE — AB

## 2020-05-25 MED ORDER — DOXYLAMINE-PYRIDOXINE 10-10 MG PO TBEC: 2.0000 | DELAYED_RELEASE_TABLET | Freq: Every day | ORAL | 1 refills | Status: AC

## 2020-05-25 MED ORDER — CEFADROXIL 500 MG PO CAPS: 500.0000 mg | ORAL_CAPSULE | Freq: Two times a day (BID) | ORAL | 0 refills | Status: AC

## 2020-05-25 NOTE — MAU Provider Note (Signed)
History     CSN: [id number]  Arrival date and time: 05/25/20 1020   First Provider Initiated Contact with Patient 05/25/20 1058      Chief Complaint  Patient presents with  . Emesis  . Possible Pregnancy   Ms. Kristin Bates is a 19 y.o. G2P1001 at [redacted]w[redacted]d who presents to MAU for morning sickness. Patient reports she started vomiting 1-2 times in the morning 2 days ago. Patient reports after she eats she feels better, but denies anything that makes it worse. Patient has not tried any treatment at home. Patient reports she only has nausea in the mornings.  Pt denies VB, LOF, ctx, decreased FM, vaginal discharge/odor/itching. Pt denies abdominal pain, constipation, diarrhea, or urinary problems. Pt denies fever, chills, fatigue, sweating or changes in appetite. Pt denies SOB or chest pain. Pt denies dizziness, HA, light-headedness, weakness.  Problems this pregnancy include: pt has not yet been seen. Allergies? NKDA Current medications/supplements? none Prenatal care provider? Pt request list of OB providers   OB History    Gravida  2   Para  1   Term  1   Preterm      AB      Living  1     SAB      TAB      Ectopic      Multiple  0   Live Births  1           Past Medical History:  Diagnosis Date  . Medical history non-contributory   . Pregnant     Past Surgical History:  Procedure Laterality Date  . NO PAST SURGERIES      Family History  Problem Relation Age of Onset  . Heart disease Father 20       Heart attack    Social History   Tobacco Use  . Smoking status: Former Smoker    Quit date: 09/09/2015    Years since quitting: 4.7  . Smokeless tobacco: Never Used  Substance Use Topics  . Alcohol use: Yes    Comment: social   . Drug use: No    Types: Marijuana    Comment: Prior to pregnancy     Allergies: No Known Allergies  Medications Prior to Admission  Medication Sig Dispense Refill Last Dose  . benzonatate (TESSALON) 100 MG  capsule Take 1 capsule (100 mg total) by mouth every 8 (eight) hours. 21 capsule 0   . fluticasone (FLONASE) 50 MCG/ACT nasal spray Place 2 sprays into both nostrils daily. 16 g 2   . ibuprofen (ADVIL,MOTRIN) 600 MG tablet Take 1 tablet (600 mg total) by mouth every 6 (six) hours. 30 tablet 0   . metroNIDAZOLE (FLAGYL) 500 MG tablet Take 1 tablet (500 mg total) by mouth 2 (two) times daily. 14 tablet 0   . predniSONE (STERAPRED UNI-PAK 21 TAB) 10 MG (21) TBPK tablet Take 6 tabs (60mg ) day 1, 5 tabs (50mg ) day 2, 4 tabs (40mg ) day 3, 3 tabs (30mg ) day 4, 2 tabs (20mg ) day 5, and 1 tab (10mg ) day 6. 21 tablet 0   . senna-docusate (SENOKOT-S) 8.6-50 MG tablet Take 2 tablets by mouth daily. 30 tablet 0     Review of Systems  Constitutional: Negative for chills, diaphoresis, fatigue and fever.  Eyes: Negative for visual disturbance.  Respiratory: Negative for shortness of breath.   Cardiovascular: Negative for chest pain.  Gastrointestinal: Positive for nausea and vomiting. Negative for abdominal pain, constipation and diarrhea.  Genitourinary: Negative  for dysuria, flank pain, frequency, pelvic pain, urgency, vaginal bleeding and vaginal discharge.  Neurological: Negative for dizziness, weakness, light-headedness and headaches.   Physical Exam   Blood pressure 111/60, pulse 71, temperature 98.2 F (36.8 C), resp. rate 16, last menstrual period 03/25/2020, SpO2 100 %.  Patient Vitals for the past 24 hrs:  BP Temp Pulse Resp SpO2  05/25/20 1033 111/60 -- -- -- --  05/25/20 1032 -- 98.2 F (36.8 C) 71 16 100 %   Physical Exam Vitals and nursing note reviewed.  Constitutional:      General: She is not in acute distress.    Appearance: Normal appearance. She is normal weight. She is not ill-appearing, toxic-appearing or diaphoretic.  HENT:     Head: Normocephalic and atraumatic.  Pulmonary:     Effort: Pulmonary effort is normal.  Neurological:     Mental Status: She is alert and  oriented to person, place, and time.  Psychiatric:        Mood and Affect: Mood normal.        Behavior: Behavior normal.        Thought Content: Thought content normal.        Judgment: Judgment normal.    Results for orders placed or performed during the hospital encounter of 05/25/20 (from the past 24 hour(s))  Pregnancy, urine POC     Status: Abnormal   Collection Time: 05/25/20 10:31 AM  Result Value Ref Range   Preg Test, Ur POSITIVE (A) NEGATIVE  Urinalysis, Routine w reflex microscopic Urine, Clean Catch     Status: Abnormal   Collection Time: 05/25/20 10:44 AM  Result Value Ref Range   Color, Urine YELLOW YELLOW   APPearance CLOUDY (A) CLEAR   Specific Gravity, Urine 1.005 1.005 - 1.030   pH 7.0 5.0 - 8.0   Glucose, UA NEGATIVE NEGATIVE mg/dL   Hgb urine dipstick NEGATIVE NEGATIVE   Bilirubin Urine NEGATIVE NEGATIVE   Ketones, ur NEGATIVE NEGATIVE mg/dL   Protein, ur NEGATIVE NEGATIVE mg/dL   Nitrite POSITIVE (A) NEGATIVE   Leukocytes,Ua SMALL (A) NEGATIVE   RBC / HPF 0-5 0 - 5 RBC/hpf   WBC, UA 6-10 0 - 5 WBC/hpf   Bacteria, UA MANY (A) NONE SEEN   Squamous Epithelial / LPF >50 (H) 0 - 5   Mucus PRESENT     MAU Course  Procedures  MDM -morning sickness -N/V not present at this time -UA: cloudy/+nitrite/sm leuks/many bacteria, sending urine for culture -pt discharged to home in stable condition  Orders Placed This Encounter  Procedures  . Culture, OB Urine    Standing Status:   Standing    Number of Occurrences:   1  . Urinalysis, Routine w reflex microscopic Urine, Clean Catch    Standing Status:   Standing    Number of Occurrences:   1  . Pregnancy, urine POC    Standing Status:   Standing    Number of Occurrences:   1  . Discharge patient    Order Specific Question:   Discharge disposition    Answer:   01-Home or Self Care [1]    Order Specific Question:   Discharge patient date    Answer:   05/25/2020   Meds ordered this encounter  Medications   . Doxylamine-Pyridoxine (DICLEGIS) 10-10 MG TBEC    Sig: Take 2 tablets by mouth at bedtime. Start by taking two tablets at bedtime on day 1 and 2; if symptoms persist, take 1 tablet  in morning and 2 tablets at bedtime on day 3; if symptoms persist, may increase to 1 tablet in morning, 1 tablet mid-afternoon, and 2 tablets at bedtime on day 4 (maximum: doxylamine 40 mg/pyridoxine 40 mg (4 tablets) per day).    Dispense:  60 tablet    Refill:  1    Order Specific Question:   Supervising Provider    Answer:   Malachy Chamber [id number]  . cefadroxil (DURICEF) 500 MG capsule    Sig: Take 1 capsule (500 mg total) by mouth 2 (two) times daily for 7 days.    Dispense:  14 capsule    Refill:  0    Order Specific Question:   Supervising Provider    Answer:   Malachy Chamber [[id number]]    Assessment and Plan   1. Nausea and vomiting in pregnancy   2. Marijuana use     Allergies as of 05/25/2020   No Known Allergies     Medication List    STOP taking these medications   ibuprofen 600 MG tablet Commonly known as: ADVIL     TAKE these medications   benzonatate 100 MG capsule Commonly known as: TESSALON Take 1 capsule (100 mg total) by mouth every 8 (eight) hours.   cefadroxil 500 MG capsule Commonly known as: DURICEF Take 1 capsule (500 mg total) by mouth 2 (two) times daily for 7 days.   Doxylamine-Pyridoxine 10-10 MG Tbec Commonly known as: Diclegis Take 2 tablets by mouth at bedtime. Start by taking two tablets at bedtime on day 1 and 2; if symptoms persist, take 1 tablet in morning and 2 tablets at bedtime on day 3; if symptoms persist, may increase to 1 tablet in morning, 1 tablet mid-afternoon, and 2 tablets at bedtime on day 4 (maximum: doxylamine 40 mg/pyridoxine 40 mg (4 tablets) per day).   fluticasone 50 MCG/ACT nasal spray Commonly known as: FLONASE Place 2 sprays into both nostrils daily.   metroNIDAZOLE 500 MG tablet Commonly known as: FLAGYL Take 1 tablet  (500 mg total) by mouth 2 (two) times daily.   predniSONE 10 MG (21) Tbpk tablet Commonly known as: STERAPRED UNI-PAK 21 TAB Take 6 tabs (60mg ) day 1, 5 tabs (50mg ) day 2, 4 tabs (40mg ) day 3, 3 tabs (30mg ) day 4, 2 tabs (20mg ) day 5, and 1 tab (10mg ) day 6.   senna-docusate 8.6-50 MG tablet Commonly known as: Senokot-S Take 2 tablets by mouth daily.       -will call with culture results, if positive -ABX sent based on urine culture, but patient desires to wait for results of culture prior to starting ABX -pt advised not to restart smoking marijuana for N/V or other reason as this can worsen symptoms of N/V, information given on cannabis hyperemesis -safe meds in pregnancy list given -RX Diclegis, pt advised about OTC Diclegis if cost of Diclegis RX is prohibitive -pt advised to take medications around the clock and not to stop taking if feeling better -discussed nonpharmacologic and pharmacologic treatments of N/V -discussed normal expectations for N/V in pregnancy -pt discharged to home in stable condition  E Jarrette Dehner 05/25/2020, 11:35 AM

## 2020-05-25 NOTE — MAU Note (Signed)
.   Kristin Bates is a 19 y.o. at Unknown here in MAU reporting: she had a positive pregnancy test last night, and has been having vomiting for a couple of days. Denies any pain or vaginal bleeding. LMP:03/25/20 Onset of complaint: 2 days Pain score: 0 Vitals:   05/25/20 1032 05/25/20 1033  BP:  111/60  Pulse: 71   Resp: 16   Temp: 98.2 F (36.8 C)   SpO2: 100%      FHT: Lab orders placed from triage: UA/UPT

## 2020-05-25 NOTE — Discharge Instructions (Signed)
Safe Medications in Pregnancy    Acne: Benzoyl Peroxide Salicylic Acid  Backache/Headache: Tylenol: 2 regular strength every 4 hours OR              2 Extra strength every 6 hours  Colds/Coughs/Allergies: Benadryl (alcohol free) 25 mg every 6 hours as needed Breath right strips Claritin Cepacol throat lozenges Chloraseptic throat spray Cold-Eeze- up to three times per day Cough drops, alcohol free Flonase (by prescription only) Guaifenesin Mucinex Robitussin DM (plain only, alcohol free) Saline nasal spray/drops Sudafed (pseudoephedrine) & Actifed ** use only after [redacted] weeks gestation and if you do not have high blood pressure Tylenol Vicks Vaporub Zinc lozenges Zyrtec   Constipation: Colace Ducolax suppositories Fleet enema Glycerin suppositories Metamucil Milk of magnesia Miralax Senokot Smooth move tea  Diarrhea: Kaopectate Imodium A-D  *NO pepto Bismol  Hemorrhoids: Anusol Anusol HC Preparation H Tucks  Indigestion: Tums Maalox Mylanta Zantac  Pepcid  Insomnia: Benadryl (alcohol free)  every 6 hours as needed Tylenol PM Unisom, no Gelcaps  Leg Cramps: Tums MagGel  Nausea/Vomiting:  Bonine Dramamine Emetrol Ginger extract Sea bands Meclizine  Nausea medication to take during pregnancy:  Unisom (doxylamine succinate 25 mg tablets) Take one tablet daily at bedtime. If symptoms are not adequately controlled, the dose can be increased to a maximum recommended dose of two tablets daily (1/2 tablet in the morning, 1/2 tablet mid-afternoon and one at bedtime). Vitamin B6  tablets. Take one tablet twice a day (up to 200 mg per day).  Skin Rashes: Aveeno products Benadryl cream or  every 6 hours as needed Calamine Lotion 1% cortisone cream  Yeast infection: Gyne-lotrimin 7 Monistat 7   **If taking multiple medications, please check labels to avoid duplicating the same active ingredients **take  medication as directed on the label ** Do not exceed 4000 mg of tylenol in 24 hours **Do not take medications that contain aspirin or ibuprofen     Prenatal Care Providers           [facility] @ [facility] - accepts patients without insurance  Phone: [phone]  [facility] [employer] @ [facility]   Phone: [phone]  [facility]  [place]       Phone: [phone]            [facility] @ [place]     Phone: [phone]          [facility] [employer] @ [employer]   Phone: [phone]  [facility] @ [facility] - accepts patients without insurance  Phone: [phone]  [facility] @ [facility] Phone: [phone]     [facility] - accepts patients without insurance Phone: [phone]  [facility]  Phone: [phone]  [facility] Phone: [phone]  [facility] Phone: [phone]  [facility] Phone: [phone]  [facility] Phone: [phone]  [facility]  Phone: [phone]         Morning Sickness  Morning sickness is when a woman feels nauseous during pregnancy. This nauseous feeling may or may not come with vomiting. It often occurs in the morning, but it can be a problem at any time of day. Morning sickness is most common during the first trimester. In some cases, it may continue throughout pregnancy. Although morning sickness is unpleasant, it is usually harmless unless the woman develops severe and continual vomiting (hyperemesis gravidarum), a condition that requires more intense  treatment. What are the causes? The exact cause of this condition is not known, but it seems to be related to normal hormonal changes that occur in pregnancy. What increases the risk? You are more likely to develop this condition if:  You experienced nausea or  vomiting before your pregnancy.  You had morning sickness during a previous pregnancy.  You are pregnant with more than one baby, such as twins. What are the signs or symptoms? Symptoms of this condition include:  Nausea.  Vomiting. How is this diagnosed? This condition is usually diagnosed based on your signs and symptoms. How is this treated? In many cases, treatment is not needed for this condition. Making some changes to what you eat may help to control symptoms. Your health care provider may also prescribe or recommend:  Vitamin B6 supplements.  Anti-nausea medicines.  Ginger. Follow these instructions at home: Medicines  Take over-the-counter and prescription medicines only as told by your health care provider. Do not use any prescription, over-the-counter, or herbal medicines for morning sickness without first talking with your health care provider.  Taking multivitamins before getting pregnant can prevent or decrease the severity of morning sickness in most women. Eating and drinking  Eat a piece of dry toast or crackers before getting out of bed in the morning.  Eat 5 or 6 small meals a day.  Eat dry and bland foods, such as rice or a baked potato. Foods that are high in carbohydrates are often helpful.  Avoid greasy, fatty, and spicy foods.  Have someone cook for you if the smell of any food causes nausea and vomiting.  If you feel nauseous after taking prenatal vitamins, take the vitamins at night or with a snack.  Snack on protein foods between meals if you are hungry. Nuts, yogurt, and cheese are good options.  Drink fluids throughout the day.  Try ginger ale made with real ginger, ginger tea made from fresh grated ginger, or ginger candies. General instructions  Do not use any products that contain nicotine or tobacco, such as cigarettes and e-cigarettes. If you need help quitting, ask your health care provider.  Get an air purifier to keep the air in  your house free of odors.  Get plenty of fresh air.  Try to avoid odors that trigger your nausea.  Consider trying these methods to help relieve symptoms: ? Wearing an acupressure wristband. These wristbands are often worn for seasickness. ? Acupuncture. Contact a health care provider if:  Your home remedies are not working and you need medicine.  You feel dizzy or light-headed.  You are losing weight. Get help right away if:  You have persistent and uncontrolled nausea and vomiting.  You faint.  You have severe pain in your abdomen. Summary  Morning sickness is when a woman feels nauseous during pregnancy. This nauseous feeling may or may not come with vomiting.  Morning sickness is most common during the first trimester.  It often occurs in the morning, but it can be a problem at any time of day.  In many cases, treatment is not needed for this condition. Making some changes to what you eat may help to control symptoms. This information is not intended to replace advice given to you by your health care provider. Make sure you discuss any questions you have with your health care provider. Document Revised: 09/08/2017 Document Reviewed: 10/29/2016 Elsevier Patient Education  2020 [employer] Inc.         Hyperemesis Gravidarum Hyperemesis gravidarum  is a severe form of nausea and vomiting that happens during pregnancy. Hyperemesis is worse than morning sickness. It may cause you to have nausea or vomiting all day for many days. It may keep you from eating and drinking enough food and liquids, which can lead to dehydration, malnutrition, and weight loss. Hyperemesis usually occurs during the first half (the first 20 weeks) of pregnancy. It often goes away once a woman is in her second half of pregnancy. However, sometimes hyperemesis continues through an entire pregnancy. What are the causes? The cause of this condition is not known. It may be related to changes in  chemicals (hormones) in the body during pregnancy, such as the high level of pregnancy hormone (human chorionic gonadotropin) or the increase in the female sex hormone (estrogen). What are the signs or symptoms? Symptoms of this condition include:  Nausea that does not go away.  Vomiting that does not allow you to keep any food down.  Weight loss.  Body fluid loss (dehydration).  Having no desire to eat, or not liking food that you have previously enjoyed. How is this diagnosed? This condition may be diagnosed based on:  A physical exam.  Your medical history.  Your symptoms.  Blood tests.  Urine tests. How is this treated? This condition is managed by controlling symptoms. This may include:  Following an eating plan. This can help lessen nausea and vomiting.  Taking prescription medicines. An eating plan and medicines are often used together to help control symptoms. If medicines do not help relieve nausea and vomiting, you may need to receive fluids through an IV at the hospital. Follow these instructions at home: Eating and drinking   Avoid the following: ? Drinking fluids with meals. Try not to drink anything during the 30 minutes before and after your meals. ? Drinking more than 1 cup of fluid at a time. ? Eating foods that trigger your symptoms. These may include spicy foods, coffee, high-fat foods, very sweet foods, and acidic foods. ? Skipping meals. Nausea can be more intense on an empty stomach. If you cannot tolerate food, do not force it. Try sucking on ice chips or other frozen items and make up for missed calories later. ? Lying down within 2 hours after eating. ? Being exposed to environmental triggers. These may include food smells, smoky rooms, closed spaces, rooms with strong smells, warm or humid places, overly loud and noisy rooms, and rooms with motion or flickering lights. Try eating meals in a well-ventilated area that is free of strong  smells. ? Quick and sudden changes in your movement. ? Taking iron pills and multivitamins that contain iron. If you take prescription iron pills, do not stop taking them unless your health care provider approves. ? Preparing food. The smell of food can spoil your appetite or trigger nausea.  To help relieve your symptoms: ? Listen to your body. Everyone is different and has different preferences. Find what works best for you. ? Eat and drink slowly. ? Eat 5-6 small meals daily instead of 3 large meals. Eating small meals and snacks can help you avoid an empty stomach. ? In the morning, before getting out of bed, eat a couple of crackers to avoid moving around on an empty stomach. ? Try eating starchy foods as these are usually tolerated well. Examples include cereal, toast, bread, potatoes, pasta, rice, and pretzels. ? Include at least 1 serving of protein with your meals and snacks. Protein options include lean meats, poultry,  seafood, beans, nuts, nut butters, eggs, cheese, and yogurt. ? Try eating a protein-rich snack before bed. Examples of a protein-rick snack include cheese and crackers or a peanut butter sandwich made with 1 slice of whole-wheat bread and 1 tsp (5 g) of peanut butter. ? Eat or suck on things that have ginger in them. It may help relieve nausea. Add  tsp ground ginger to hot tea or choose ginger tea. ? Try drinking 100% fruit juice or an electrolyte drink. An electrolyte drink contains sodium, potassium, and chloride. ? Drink fluids that are cold, clear, and carbonated or sour. Examples include lemonade, ginger ale, lemon-lime soda, ice water, and sparkling water. ? Brush your teeth or use a mouth rinse after meals. ? Talk with your health care provider about starting a supplement of vitamin B6. General instructions  Take over-the-counter and prescription medicines only as told by your health care provider.  Follow instructions from your health care provider about  eating or drinking restrictions.  Continue to take your prenatal vitamins as told by your health care provider. If you are having trouble taking your prenatal vitamins, talk with your health care provider about different options.  Keep all follow-up and pre-birth (prenatal) visits as told by your health care provider. This is important. Contact a health care provider if:  You have pain in your abdomen.  You have a severe headache.  You have vision problems.  You are losing weight.  You feel weak or dizzy. Get help right away if:  You cannot drink fluids without vomiting.  You vomit blood.  You have constant nausea and vomiting.  You are very weak.  You faint.  You have a fever and your symptoms suddenly get worse. Summary  Hyperemesis gravidarum is a severe form of nausea and vomiting that happens during pregnancy.  Making some changes to your eating habits may help relieve nausea and vomiting.  This condition may be managed with medicine.  If medicines do not help relieve nausea and vomiting, you may need to receive fluids through an IV at the hospital. This information is not intended to replace advice given to you by your health care provider. Make sure you discuss any questions you have with your health care provider. Document Revised: 10/16/2017 Document Reviewed: 05/25/2016 Elsevier Patient Education  2020 [employer].         Marijuana Use During Pregnancy and Breastfeeding  Marijuana is the dried leaves, flowers, and stems of the Cannabis sativa or Cannabis indica plant. The plant's active ingredients (cannabinoids), including a chemical called THC, change the chemistry of the brain. Marijuana smoke also has many of the same chemicals as cigarette smoke that cause breathing problems. Marijuana gets into your blood through your lungs when you smoke it and through your digestive system when you swallow it. Using marijuana in any form may be harmful for you  and your baby when you are trying to become pregnant and during pregnancy. This includes marijuana that is prescribed to you by a health care provider (medical marijuana). Once marijuana is in your blood, it can travel through your placenta to your baby. It may also pass through breast milk. How does this affect me? Marijuana affects you both mentally and physically. Using marijuana can make you feel high and relaxed. It can also have negative effects, especially at high doses or with long-term use. These include:  Rapid heartbeat and stress on your heart.  Lung irritation and breathing problems.  Difficulty thinking and making  decisions.  Seeing or believing things that are not true (hallucinations and paranoia).  Mood swings, depression, or anxiety.  Decreased ability to learn and remember.  Difficulty getting pregnant. Marijuana can also affect your pregnancy. Not all the effects are known. However, if you use marijuana during pregnancy, you may:  Be less likely to get regular prenatal care and do the things that you need to do to have a healthy pregnancy.  Be more likely to use other drugs that can harm your pregnancy, like drinking alcohol and smoking cigarettes.  Be at higher risk of having your baby die after 28 weeks of pregnancy (stillbirth).  Be at higher risk of giving birth before 37 weeks of pregnancy (premature birth). How does this affect my baby? If you use marijuana during pregnancy, this may affect your baby's development, birth, and life after birth. Your baby may:  Be born prematurely, which can cause physical and mental problems.  Be born with a low birth weight, which can lead to physical and mental problems.  Have problems with brain development.  Have difficulty growing.  Have attention and behavior problems later in life.  Do poorly at school and have learning problems later in life.  Have problems with vision and coordination.  Be at higher risk  for using marijuana by age 24. More research is needed to find out exactly how marijuana affects a baby during breastfeeding. Some studies suggest that the chemicals in marijuana can be passed to a baby through breast milk. To limit possible risks, you should not use marijuana during breastfeeding. Follow these instructions at home:  Let your health care provider know if you use marijuana before trying to get pregnant, during pregnancy, or during breastfeeding.  Do not use marijuana in any form when you are trying to get pregnant, when you are pregnant, or when you are breastfeeding. If you are having trouble stopping marijuana use, ask your health care provider for help.  Do not smoke. If you need help quitting, ask your health care provider for help.  If you are using medical marijuana, ask your health care provider to switch you to a medicine that is safer to use during pregnancy or breastfeeding.  Keep all your prenatal visits as told by your health care provider. This is important. Where to find more information [employer] on Drug Abuse: www.drugabuse.gov March of Dimes: www.marchofdimes.org/pregnancy Contact a health care provider if:  You use marijuana and want to get pregnant.  You use marijuana during pregnancy or breastfeeding.  You need help stopping marijuana use. Get help right away if:  Your baby is not gaining weight or growing as expected. Summary  Using marijuana in any form may be harmful for you and your baby when you are trying to become pregnant, during pregnancy, and during breastfeeding. This includes marijuana that is prescribed to you (medical marijuana).  Some studies suggest that marijuana may pass through breast milk and can affect your baby's brain development.  Talk to your health care provider if you use marijuana in any form while trying to get pregnant, during pregnancy, or while breastfeeding.  Ask your health care provider for help if you  are not able to stop using marijuana. This information is not intended to replace advice given to you by your health care provider. Make sure you discuss any questions you have with your health care provider. Document Revised: 01/18/2019 Document Reviewed: 06/14/2017 Elsevier Patient Education  2020 [employer].  Cannabinoid Hyperemesis Syndrome Cannabinoid hyperemesis syndrome (CHS) is a condition that causes repeated nausea, vomiting, and abdominal pain after long-term (chronic) use of marijuana (cannabis). People with CHS typically use marijuana 3-5 times a day for many years before they have symptoms, although it is possible to develop CHS with as little as 1 use per day. Symptoms of CHS may be mild at first but can get worse and more frequent. In some cases, CHS may cause vomiting many times a day, which can lead to weight loss and dehydration. CHS may go away and come back many times (recur). People may not have symptoms or may otherwise be healthy in between [facility] attacks. What are the causes? The exact cause of this condition is not known. Long-term use of marijuana may over-stimulate certain proteins in the brain that react with chemicals in marijuana (cannabinoid receptors). This over-stimulation may cause CHS. What are the signs or symptoms? Symptoms of this condition are often mild during the first few attacks, but they can get worse over time. Symptoms may include:  Frequent nausea, especially early in the morning.  Vomiting.  Abdominal pain. Taking several hot showers throughout the day can also be a sign of this condition. People with CHS may do this because it relieves symptoms. How is this diagnosed? This condition may be diagnosed based on:  Your symptoms and medical history, including any drug use.  A physical exam. You may have tests done to rule out other problems. These tests may include:  Blood tests.  Urine tests.  Imaging tests, such as an X-ray or  CT scan. How is this treated? Treatment for this condition involves stopping marijuana use. Your health care provider may recommend:  A drug rehabilitation program, if you have trouble stopping marijuana use.  Medicines for nausea.  Hot showers to help relieve symptoms. Certain creams that contain a substance called capsaicin may improve symptoms when applied to the abdomen. Ask your health care provider before starting any medicines or other treatments. Severe nausea and vomiting may require you to stay at the hospital. You may need IV fluids to prevent or treat dehydration. You may also need certain medicines that must be given at the hospital. Follow these instructions at home: During an attack   Stay in bed and rest in a dark, quiet room.  Take anti-nausea medicine as told by your health care provider.  Try taking hot showers to relieve your symptoms. After an attack  Drink small amounts of clear fluids slowly. Gradually add more.  Once you are able to eat without vomiting, eat soft foods in small amounts every 3-4 hours. General instructions   Do not use any products that contain marijuana.If you need help quitting, ask your health care provider for resources and treatment options.  Drink enough fluid to keep your urine pale yellow. Avoid drinking fluids that have a lot of sugar or caffeine, such as coffee and soda.  Take and apply over-the-counter and prescription medicines only as told by your health care provider. Ask your health care provider before starting any new medicines or treatments.  Keep all follow-up visits as told by your health care provider. This is important. Contact a health care provider if:  Your symptoms get worse.  You cannot drink fluids without vomiting.  You have pain and trouble swallowing after an attack. Get help right away if:  You cannot stop vomiting.  You have blood in your vomit or your vomit looks like coffee grounds.  You have  severe abdominal pain.  You have stools that are bloody or black, or stools that look like tar.  You have symptoms of dehydration, such as: ? Sunken eyes. ? Inability to make tears. ? Cracked lips. ? Dry mouth. ? Decreased urine production. ? Weakness. ? Sleepiness. ? Fainting. Summary  Cannabinoid hyperemesis syndrome (CHS) is a condition that causes repeated nausea, vomiting, and abdominal pain after long-term use of marijuana.  People with CHS typically use marijuana 3-5 times a day for many years before they have symptoms, although it is possible to develop CHS with as little as 1 use per day.  Treatment for this condition involves stopping marijuana use. Hot showers and capsaicin creams may also help relieve symptoms. Ask your health care provider before starting any medicines or other treatments.  Your health care provider may prescribe medicines to help with nausea.  Get help right away if you have signs of dehydration, such as dry mouth, decreased urine production, or weakness. This information is not intended to replace advice given to you by your health care provider. Make sure you discuss any questions you have with your health care provider. Document Revised: 02/02/2018 Document Reviewed: 01/04/2017 Elsevier Patient Education  2020 [employer].

## 2020-05-27 ENCOUNTER — Encounter: Payer: Self-pay | Admitting: Women's Health

## 2020-05-27 DIAGNOSIS — T50901A Poisoning by unspecified drugs, medicaments and biological substances, accidental (unintentional), initial encounter: Secondary | ICD-10-CM

## 2020-05-27 DIAGNOSIS — O2341 Unspecified infection of urinary tract in pregnancy, first trimester: Secondary | ICD-10-CM | POA: Insufficient documentation

## 2020-05-27 HISTORY — DX: Poisoning by unspecified drugs, medicaments and biological substances, accidental (unintentional), initial encounter: T50.901A

## 2020-05-27 LAB — CULTURE, OB URINE: Culture: >=100000 — AB

## 2020-05-28 ENCOUNTER — Encounter (HOSPITAL_COMMUNITY): Payer: Self-pay | Admitting: *Deleted

## 2020-05-28 ENCOUNTER — Inpatient Hospital Stay (HOSPITAL_COMMUNITY)
Admission: AD | Admit: 2020-05-28 | Discharge: 2020-05-28 | Disposition: A | Discharge status: Home / Self Care | Payer: Self-pay | Attending: Obstetrics and Gynecology | Admitting: Obstetrics and Gynecology

## 2020-05-28 ENCOUNTER — Emergency Department (HOSPITAL_COMMUNITY)
Admission: EM | Admit: 2020-05-28 | Discharge: 2020-05-28 | Disposition: A | Discharge status: Home / Self Care | Payer: Self-pay | Attending: Emergency Medicine | Admitting: Emergency Medicine

## 2020-05-28 ENCOUNTER — Encounter (HOSPITAL_COMMUNITY): Payer: Self-pay | Admitting: Obstetrics and Gynecology

## 2020-05-28 ENCOUNTER — Other Ambulatory Visit: Payer: Self-pay

## 2020-05-28 ENCOUNTER — Inpatient Hospital Stay (HOSPITAL_COMMUNITY): Payer: Self-pay

## 2020-05-28 DIAGNOSIS — O9A219 Injury, poisoning and certain other consequences of external causes complicating pregnancy, unspecified trimester: Secondary | ICD-10-CM | POA: Insufficient documentation

## 2020-05-28 DIAGNOSIS — O26619 Liver and biliary tract disorders in pregnancy, unspecified trimester: Secondary | ICD-10-CM | POA: Insufficient documentation

## 2020-05-28 DIAGNOSIS — O219 Vomiting of pregnancy, unspecified: Secondary | ICD-10-CM | POA: Insufficient documentation

## 2020-05-28 DIAGNOSIS — T50901A Poisoning by unspecified drugs, medicaments and biological substances, accidental (unintentional), initial encounter: Secondary | ICD-10-CM | POA: Insufficient documentation

## 2020-05-28 DIAGNOSIS — Z87891 Personal history of nicotine dependence: Secondary | ICD-10-CM | POA: Insufficient documentation

## 2020-05-28 DIAGNOSIS — R109 Unspecified abdominal pain: Secondary | ICD-10-CM

## 2020-05-28 DIAGNOSIS — O26891 Other specified pregnancy related conditions, first trimester: Secondary | ICD-10-CM | POA: Insufficient documentation

## 2020-05-28 DIAGNOSIS — Z3A01 Less than 8 weeks gestation of pregnancy: Secondary | ICD-10-CM | POA: Insufficient documentation

## 2020-05-28 DIAGNOSIS — R1030 Lower abdominal pain, unspecified: Secondary | ICD-10-CM | POA: Insufficient documentation

## 2020-05-28 DIAGNOSIS — Z9189 Other specified personal risk factors, not elsewhere classified: Secondary | ICD-10-CM

## 2020-05-28 DIAGNOSIS — R748 Abnormal levels of other serum enzymes: Secondary | ICD-10-CM

## 2020-05-28 DIAGNOSIS — Z3490 Encounter for supervision of normal pregnancy, unspecified, unspecified trimester: Secondary | ICD-10-CM

## 2020-05-28 DIAGNOSIS — Z3A00 Weeks of gestation of pregnancy not specified: Secondary | ICD-10-CM | POA: Insufficient documentation

## 2020-05-28 DIAGNOSIS — Z79899 Other long term (current) drug therapy: Secondary | ICD-10-CM | POA: Insufficient documentation

## 2020-05-28 HISTORY — DX: Unspecified infectious disease: B99.9

## 2020-05-28 LAB — I-STAT BETA HCG BLOOD, ED (MC, WL, AP ONLY): I-stat hCG, quantitative: >2000 m[IU]/mL — ABNORMAL HIGH (ref <5)

## 2020-05-28 LAB — URINALYSIS, ROUTINE W REFLEX MICROSCOPIC
Bacteria, UA: NONE SEEN
Bilirubin Urine: NEGATIVE
Glucose, UA: 50 mg/dL — AB
Hgb urine dipstick: NEGATIVE
Ketones, ur: 5 mg/dL — AB
Leukocytes,Ua: NEGATIVE
Nitrite: POSITIVE — AB
Protein, ur: 30 mg/dL — AB
Specific Gravity, Urine: 1.02 (ref 1.005–1.030)
pH: 6 (ref 5.0–8.0)

## 2020-05-28 LAB — COMPREHENSIVE METABOLIC PANEL
ALT: 89 U/L — ABNORMAL HIGH (ref 0–44)
ALT: 86 U/L — ABNORMAL HIGH (ref 0–44)
AST: 181 U/L — ABNORMAL HIGH (ref 15–41)
AST: 94 U/L — ABNORMAL HIGH (ref 15–41)
Albumin: 3.4 g/dL — ABNORMAL LOW (ref 3.5–5.0)
Albumin: 3.4 g/dL — ABNORMAL LOW (ref 3.5–5.0)
Alkaline Phosphatase: 57 U/L (ref 38–126)
Alkaline Phosphatase: 44 U/L (ref 38–126)
Anion gap: 6 (ref 5–15)
Anion gap: 7 (ref 5–15)
BUN: 13 mg/dL (ref 6–20)
BUN: 7 mg/dL (ref 6–20)
CO2: 25 mmol/L (ref 22–32)
CO2: 23 mmol/L (ref 22–32)
Calcium: 8.1 mg/dL — ABNORMAL LOW (ref 8.9–10.3)
Calcium: 8.1 mg/dL — ABNORMAL LOW (ref 8.9–10.3)
Chloride: 106 mmol/L (ref 98–111)
Chloride: 104 mmol/L (ref 98–111)
Creatinine, Ser: 0.9 mg/dL (ref 0.44–1.00)
Creatinine, Ser: 0.62 mg/dL (ref 0.44–1.00)
GFR calc Af Amer: >60 mL/min (ref >60)
GFR calc Af Amer: >60 mL/min (ref >60)
GFR calc non Af Amer: >60 mL/min (ref >60)
GFR calc non Af Amer: >60 mL/min (ref >60)
Glucose, Bld: 181 mg/dL — ABNORMAL HIGH (ref 70–99)
Glucose, Bld: 99 mg/dL (ref 70–99)
Potassium: 3.9 mmol/L (ref 3.5–5.1)
Potassium: 3.9 mmol/L (ref 3.5–5.1)
Sodium: 137 mmol/L (ref 135–145)
Sodium: 134 mmol/L — ABNORMAL LOW (ref 135–145)
Total Bilirubin: 0.4 mg/dL (ref 0.3–1.2)
Total Bilirubin: 0.5 mg/dL (ref 0.3–1.2)
Total Protein: 5.4 g/dL — ABNORMAL LOW (ref 6.5–8.1)
Total Protein: 5.6 g/dL — ABNORMAL LOW (ref 6.5–8.1)

## 2020-05-28 LAB — CBC WITH DIFFERENTIAL/PLATELET
Abs Immature Granulocytes: 0.07 10*3/uL (ref 0.00–0.07)
Basophils Absolute: 0 10*3/uL (ref 0.0–0.1)
Basophils Relative: 0 %
Eosinophils Absolute: 0 10*3/uL (ref 0.0–0.5)
Eosinophils Relative: 0 %
HCT: 35.8 % — ABNORMAL LOW (ref 36.0–46.0)
Hemoglobin: 11.4 g/dL — ABNORMAL LOW (ref 12.0–15.0)
Immature Granulocytes: 1 %
Lymphocytes Relative: 8 %
Lymphs Abs: 0.9 10*3/uL (ref 0.7–4.0)
MCH: 32.4 pg (ref 26.0–34.0)
MCHC: 31.8 g/dL (ref 30.0–36.0)
MCV: 101.7 fL — ABNORMAL HIGH (ref 80.0–100.0)
Monocytes Absolute: 0.4 10*3/uL (ref 0.1–1.0)
Monocytes Relative: 4 %
Neutro Abs: 8.9 10*3/uL — ABNORMAL HIGH (ref 1.7–7.7)
Neutrophils Relative %: 87 %
Platelets: 152 10*3/uL (ref 150–400)
RBC: 3.52 MIL/uL — ABNORMAL LOW (ref 3.87–5.11)
RDW: 12.3 % (ref 11.5–15.5)
WBC: 10.2 10*3/uL (ref 4.0–10.5)
nRBC: 0 % (ref 0.0–0.2)

## 2020-05-28 LAB — RAPID URINE DRUG SCREEN, HOSP PERFORMED
Amphetamines: NOT DETECTED
Barbiturates: NOT DETECTED
Benzodiazepines: POSITIVE — AB
Cocaine: NOT DETECTED
Opiates: NOT DETECTED
Tetrahydrocannabinol: POSITIVE — AB

## 2020-05-28 LAB — HCG, QUANTITATIVE, PREGNANCY: hCG, Beta Chain, Quant, S: 80789 m[IU]/mL — ABNORMAL HIGH (ref <5)

## 2020-05-28 LAB — ETHANOL: Alcohol, Ethyl (B): <10 mg/dL (ref <10)

## 2020-05-28 LAB — WET PREP, GENITAL
Clue Cells Wet Prep HPF POC: NONE SEEN
Sperm: NONE SEEN
Trich, Wet Prep: NONE SEEN
Yeast Wet Prep HPF POC: NONE SEEN

## 2020-05-28 MED ORDER — SODIUM CHLORIDE 0.9 % IV BOLUS
1000.0000 mL | Freq: Once | INTRAVENOUS | Status: AC
  Administered 2020-05-28: 1000 mL via INTRAVENOUS

## 2020-05-28 MED ORDER — NALOXONE HCL 4 MG/0.1ML NA LIQD
NASAL | 0 refills | Status: DC
Start: 2020-05-28 — End: 2020-12-18

## 2020-05-28 MED ORDER — METOCLOPRAMIDE HCL 10 MG PO TABS: 10.0000 mg | ORAL_TABLET | Freq: Four times a day (QID) | ORAL | 1 refills | Status: DC

## 2020-05-28 MED ORDER — PROMETHAZINE HCL 25 MG/ML IJ SOLN: 12.5000 mg | Freq: Once | INTRAMUSCULAR | Status: DC

## 2020-05-28 MED ORDER — METOCLOPRAMIDE HCL 10 MG PO TABS
10.0000 mg | ORAL_TABLET | Freq: Once | ORAL | Status: AC
  Administered 2020-05-28: 10 mg via ORAL
  Filled 2020-05-28: qty 1

## 2020-05-28 NOTE — MAU Provider Note (Addendum)
Patient Kristin Bates is a 19 y.o. G2P1001  at [redacted]w[redacted]d here after being seen and evaluated in the [facility] last night for supsected heroin od. Patient was discharged this morning at 0731 from [facility]. She says that she was told to come to MAU to "check on the baby". She is reporting abdominal pain and states that she has "No idea" how many weeks she is; her LMP date of 6/16 is just a guess.  She denies abnormal discharge, vaginal bleeding, STIs, dysuria. She reports some nausea and vomiting that started yesterday.  History     CSN: [id number]  Arrival date and time: 05/28/20 0849   First Provider Initiated Contact with Patient 05/28/20 [phone]      Chief Complaint  Patient presents with  . Abdominal Pain  . Emesis   Emesis  This is a new problem. The current episode started yesterday. The problem occurs 2 to 4 times per day. There has been no fever. Associated symptoms include abdominal pain.  She denies vaginal bleeding, vaginal discharge, dysuria.  She describes the pain as "regular period cramps". These started yesterday.   OB History    Gravida  2   Para  1   Term  1   Preterm      AB      Living  1     SAB      TAB      Ectopic      Multiple  0   Live Births  1           Past Medical History:  Diagnosis Date  . Drug overdose 05/27/2020   pt does not know what she tood  . Infection    UTI  . Medical history non-contributory   . Pregnant     Past Surgical History:  Procedure Laterality Date  . NO PAST SURGERIES      Family History  Problem Relation Age of Onset  . Healthy Mother   . Heart disease Father 77       Heart attack    Social History   Tobacco Use  . Smoking status: Former Smoker    Quit date: 09/09/2015    Years since quitting: 4.7  . Smokeless tobacco: Never Used  Vaping Use  . Vaping Use: Never used  Substance Use Topics  . Alcohol use: Not Currently    Comment: social   . Drug use: Yes    Types: Marijuana, IV, Benzodiazepines     Comment: says stopped with +preg, was smoking daily prior states was taking xanax every 3 days    Allergies: No Known Allergies  Medications Prior to Admission  Medication Sig Dispense Refill Last Dose  . benzonatate (TESSALON) 100 MG capsule Take 1 capsule (100 mg total) by mouth every 8 (eight) hours. (Patient not taking: Reported on 05/28/2020) 21 capsule 0   . cefadroxil (DURICEF) 500 MG capsule Take 1 capsule (500 mg total) by mouth 2 (two) times daily for 7 days. (Patient not taking: Reported on 05/28/2020) 14 capsule 0   . Doxylamine-Pyridoxine (DICLEGIS) 10-10 MG TBEC Take 2 tablets by mouth at bedtime. Start by taking two tablets at bedtime on day 1 and 2; if symptoms persist, take 1 tablet in morning and 2 tablets at bedtime on day 3; if symptoms persist, may increase to 1 tablet in morning, 1 tablet mid-afternoon, and 2 tablets at bedtime on day 4 (maximum: doxylamine 40 mg/pyridoxine 40 mg (4 tablets) per day). (Patient not taking: Reported  on 05/28/2020) 60 tablet 1   . fluticasone (FLONASE) 50 MCG/ACT nasal spray Place 2 sprays into both nostrils daily. (Patient not taking: Reported on 05/28/2020) 16 g 2   . metroNIDAZOLE (FLAGYL) 500 MG tablet Take 1 tablet (500 mg total) by mouth 2 (two) times daily. (Patient not taking: Reported on 05/28/2020) 14 tablet 0   . naloxone (NARCAN) nasal spray 4 mg/0.1 mL For drug overdose. 1 each 0   . predniSONE (STERAPRED UNI-PAK 21 TAB) 10 MG (21) TBPK tablet Take 6 tabs (60mg ) day 1, 5 tabs (50mg ) day 2, 4 tabs (40mg ) day 3, 3 tabs (30mg ) day 4, 2 tabs (20mg ) day 5, and 1 tab (10mg ) day 6. (Patient not taking: Reported on 05/28/2020) 21 tablet 0   . senna-docusate (SENOKOT-S) 8.6-50 MG tablet Take 2 tablets by mouth daily. (Patient not taking: Reported on 05/28/2020) 30 tablet 0     Review of Systems  Constitutional: Negative.   HENT: Negative.   Respiratory: Negative.   Gastrointestinal: Positive for abdominal pain and vomiting.  Genitourinary:  Negative.   Allergic/Immunologic: Negative.    Physical Exam   Blood pressure 104/72, pulse 80, temperature 98.1 F (36.7 C), temperature source Oral, resp. rate 16, height 5\' 3"  (1.6 m), weight 69.2 kg, last menstrual period 03/25/2020, SpO2 100 %.  Physical Exam Constitutional:      Appearance: She is well-developed.  Pulmonary:     Effort: Pulmonary effort is normal.  Abdominal:     Tenderness: There is no abdominal tenderness.  Genitourinary:    Vagina: Normal.  Neurological:     Mental Status: She is alert.   Cervix is closed, posterior, no tenderness on exam.    MAU Course  Procedures  MDM -ectopic work up performed; images reviewed by me personally which show finding of IUP at 6 weeks 5 days with fetal pole.  -CMP shows that LFTs are now decreasing -patient feels better after Reglan. Patient desires discharge after no more vomiting.  Assessment and Plan   1. Lower abdominal pain   2. Abdominal pain    -discharge home with return precautions.  -list of providers given to start prenatal care -Rx for reglan given -Will send message to to reach out to patient given history of OD.   05/30/2020 Osmany Azer 05/28/2020, 9:44 AM

## 2020-05-28 NOTE — ED Provider Notes (Signed)
Patient seen/examined in the Emergency Department in conjunction with Advanced Practice Provider [facility] Patient presents after presumed overdose, responded to narcan She is currently pregnant.  Denies abd pain/vag bleeding Exam : sleeping but easily arousable.  Maex4.  No focal abd tenderness Plan: will monitor, labs pending at this time    Zadie Rhine, MD 05/28/20 [phone]

## 2020-05-28 NOTE — ED Triage Notes (Signed)
The pt arrived by gems from home she has a history of heroin drug use ems arrived to find the pt  Pinpoint pupils with agonal respirations she was given 2 mg of narcan by ems initial bp 80/50  Iv lt a-c ems gave 750 ml nss  Her lmp was June she thinks shes pregnant  Sleeps unless shes disturbed

## 2020-05-28 NOTE — ED Notes (Signed)
Pt ambulated steady with no assistance

## 2020-05-28 NOTE — MAU Note (Signed)
Had an overdose last night, so the emergency room sent her to get an [place] and check on the baby type stuff.  Her whole stomach hurts, can't keep anything down. Denies bleeding.

## 2020-05-28 NOTE — ED Notes (Signed)
nanal o2 at 2 liters per order edp for sats89

## 2020-05-28 NOTE — ED Notes (Signed)
zofran given at 0130 in downtim[facility]

## 2020-05-28 NOTE — ED Notes (Signed)
Patient verbalizes understanding of discharge instructions. Opportunity for questioning and answers were provided. Pt discharged from ED. 

## 2020-05-28 NOTE — Discharge Instructions (Signed)
[facility] Providers    [facility]       Phone: [phone]  [facility]   Phone: [phone]  [facility]  Phone: [phone]  [facility]  Phone: [phone]  [facility]  Phone: [phone]  [facility]   Phone: [phone]  [facility]       Phone: [phone]  [facility]    Phone: [phone]   [facility]    Phone: [phone]  [facility]    Phone: [phone]  [facility]    Phone: [phone]  [facility]       Phone: [phone]   [facility]  Phone: [phone]  [facility]    Phone: [phone]  [facility]   Phone: [phone]  [facility]      Phone: [phone]  [facility]    Phone: [phone]   

## 2020-05-28 NOTE — Discharge Instructions (Addendum)
Follow up with your primary care provider regarding your elevated liver enzymes. Follow up at the women's clinic for your prenatal care. Stop using drugs. Return to the ED or the women's hospital for abdominal pain, vaginal bleeding; chest pain, shortness of breath.

## 2020-05-28 NOTE — ED Provider Notes (Signed)
[facility] EMERGENCY DEPARTMENT Provider Note   CSN: [id number] Arrival date & time: 05/28/20  0036     History Chief Complaint  Patient presents with  . Altered Mental Status    Kristin Bates is a 19 y.o. female who is currently pregnant at unknown gestation presenting to ED after overdose.  Majority of history is provided by EMS.  States that patient was unconscious for about an hour before they were called.  Boyfriend thought that she would wake up.  He told EMS that she has a history of Xanax use but denies any other drug use.  Patient denies drinking alcohol or using any illicit drugs tonight.  She was given 1 mg of IN Narcan by fire and 1 mg of IV Narcan by EMS with improvement.  Patient denies any chest pain, abdominal pain, vaginal bleeding, headache, blurry vision. Denies intent to self harm.  HPI     Past Medical History:  Diagnosis Date  . Medical history non-contributory   . Pregnant     Patient Active Problem List   Diagnosis Date Noted  . Urinary tract infection in pregnancy, antepartum, first trimester 05/27/2020    Past Surgical History:  Procedure Laterality Date  . NO PAST SURGERIES       OB History    Gravida  2   Para  1   Term  1   Preterm      AB      Living  1     SAB      TAB      Ectopic      Multiple  0   Live Births  1           Family History  Problem Relation Age of Onset  . Heart disease Father 16       Heart attack    Social History   Tobacco Use  . Smoking status: Former Smoker    Quit date: 09/09/2015    Years since quitting: 4.7  . Smokeless tobacco: Never Used  Substance Use Topics  . Alcohol use: Yes    Comment: social   . Drug use: Yes    Types: Marijuana, IV    Comment: Prior to pregnancy     Home Medications Prior to Admission medications   Medication Sig Start Date End Date Taking? Authorizing Provider  benzonatate (TESSALON) 100 MG capsule Take 1 capsule (100 mg total)  by mouth every 8 (eight) hours. Patient not taking: Reported on 05/28/2020 07/23/19   Joy, Raquel Sarna C, PA-C  cefadroxil (DURICEF) 500 MG capsule Take 1 capsule (500 mg total) by mouth 2 (two) times daily for 7 days. Patient not taking: Reported on 05/28/2020 05/25/20 06/01/20  Nugent, Gerrie Nordmann, NP  Doxylamine-Pyridoxine (DICLEGIS) 10-10 MG TBEC Take 2 tablets by mouth at bedtime. Start by taking two tablets at bedtime on day 1 and 2; if symptoms persist, take 1 tablet in morning and 2 tablets at bedtime on day 3; if symptoms persist, may increase to 1 tablet in morning, 1 tablet mid-afternoon, and 2 tablets at bedtime on day 4 (maximum: doxylamine 40 mg/pyridoxine 40 mg (4 tablets) per day). Patient not taking: Reported on 05/28/2020 05/25/20 06/24/20  Nugent, Gerrie Nordmann, NP  fluticasone (FLONASE) 50 MCG/ACT nasal spray Place 2 sprays into both nostrils daily. Patient not taking: Reported on 05/28/2020 07/23/19   Joy, Raquel Sarna C, PA-C  metroNIDAZOLE (FLAGYL) 500 MG tablet Take 1 tablet (500 mg total) by mouth 2 (  two) times daily. Patient not taking: Reported on 05/28/2020 03/21/19   Robinson, [place] N, PA-C  naloxone [facility]) nasal spray 4 mg/0.1 mL For drug overdose. 05/28/20   Nekhi Liwanag, PA-C  predniSONE (STERAPRED UNI-PAK 21 TAB) 10 MG (21) TBPK tablet Take 6 tabs (65m) day 1, 5 tabs (548m day 2, 4 tabs (4039mday 3, 3 tabs (22m42may 4, 2 tabs (20mg59my 5, and 1 tab (10mg)56m 6. Patient not taking: Reported on 05/28/2020 07/23/19   Joy, Shawn C, PA-C  senna-docusate (SENOKOT-S) 8.6-50 MG tablet Take 2 tablets by mouth daily. Patient not taking: Reported on 05/28/2020 07/02/16   Yoo, EBufford Lope  Allergies    Patient has no known allergies.  Review of Systems   Review of Systems  Constitutional: Negative for appetite change, chills and fever.  HENT: Negative for ear pain, rhinorrhea, sneezing and sore throat.   Eyes: Negative for photophobia and visual disturbance.  Respiratory: Negative for cough,  chest tightness, shortness of breath and wheezing.   Cardiovascular: Negative for chest pain and palpitations.  Gastrointestinal: Positive for vomiting. Negative for abdominal pain, blood in stool, constipation, diarrhea and nausea.  Genitourinary: Negative for dysuria, hematuria and urgency.  Musculoskeletal: Negative for myalgias.  Skin: Negative for rash.  Neurological: Negative for dizziness, weakness and light-headedness.    Physical Exam Updated Vital Signs BP 106/76 (BP Location: Left Arm)   Pulse (!) 107   Temp 97.9 F (36.6 C) (Oral)   Resp 16   Ht 5' 3"  (1.6 m)   Wt 70.2 kg   LMP 03/25/2020   SpO2 95%   BMI 27.42 kg/m   Physical Exam Vitals and nursing note reviewed.  Constitutional:      General: She is not in acute distress.    Appearance: She is well-developed.  HENT:     Head: Normocephalic and atraumatic.     Nose: Nose normal.  Eyes:     General: No scleral icterus.       Right eye: No discharge.        Left eye: No discharge.     Conjunctiva/sclera: Conjunctivae normal.     Pupils: Pupils are equal, round, and reactive to light.  Cardiovascular:     Rate and Rhythm: Normal rate and regular rhythm.     Heart sounds: Normal heart sounds. No murmur heard.  No friction rub. No gallop.   Pulmonary:     Effort: Pulmonary effort is normal. No respiratory distress.     Breath sounds: Normal breath sounds.  Abdominal:     General: Bowel sounds are normal. There is no distension.     Palpations: Abdomen is soft.     Tenderness: There is no abdominal tenderness. There is no guarding.  Musculoskeletal:        General: Normal range of motion.     Cervical back: Normal range of motion and neck supple.  Skin:    General: Skin is warm and dry.     Findings: No rash.  Neurological:     Mental Status: She is alert.     Motor: No abnormal muscle tone.     Coordination: Coordination normal.     Comments: Pinpoint pupils.  Alert and oriented x4.  No facial  asymmetry noted.  5/5 strength of bilateral upper and lower extremities.  Sensation to light touch of bilateral upper and lower extremities.     ED Results / Procedures / Treatments   Labs (all labs  ordered are listed, but only abnormal results are displayed) Labs Reviewed  COMPREHENSIVE METABOLIC PANEL - Abnormal; Notable for the following components:      Result Value   Glucose, Bld 181 (*)    Calcium 8.1 (*)    Total Protein 5.4 (*)    Albumin 3.4 (*)    AST 181 (*)    ALT 89 (*)    All other components within normal limits  CBC WITH DIFFERENTIAL/PLATELET - Abnormal; Notable for the following components:   RBC 3.52 (*)    Hemoglobin 11.4 (*)    HCT 35.8 (*)    MCV 101.7 (*)    Neutro Abs 8.9 (*)    All other components within normal limits  I-STAT BETA HCG BLOOD, ED ([facility], [facility], [facility] ONLY) - Abnormal; Notable for the following components:   I-stat hCG, quantitative >2,000.0 (*)    All other components within normal limits  ETHANOL  RAPID URINE DRUG SCREEN, HOSP PERFORMED    EKG ED ECG REPORT   Date: 05/28/2020  Rate: 107  Rhythm: sinus tachycardia  QRS Axis: normal  Intervals: normal  ST/T Wave abnormalities: normal  Conduction Disutrbances:none  Narrative Interpretation:   Old EKG Reviewed: none available  I have personally reviewed the EKG tracing and agree with the computerized printout as noted.   Radiology No results found.  Procedures Procedures (including critical care time)  Medications Ordered in ED Medications  sodium chloride 0.9 % bolus 1,000 mL (1,000 mLs Intravenous New Bag/Given 05/28/20 0100)    ED Course  I have reviewed the triage vital signs and the nursing notes.  Pertinent labs & imaging results that were available during my care of the patient were reviewed by me and considered in my medical decision making (see chart for details).  Clinical Course as of May 28 629  Thu May 28, 2020  0424 Patient satting in the low 90s on room air as  she refuses to wear nasal cannula.   [HK]  0456 Attempted PO challenge after patient more awake and requesting Sprite. This was given to her, unfortunately she had an episode of emesis afterwards. Will give Phenergan and continue to monitor.   [HK]    Clinical Course User Index [HK] Delia Heady, PA-C   MDM Rules/Calculators/A&P                          [phone] F currently pregnant at unknown gestation presenting to the ED after possible heroin overdose.  Patient given a total of 2 mg of Narcan with good response.  Patient is denying any drug use or alcohol use tonight.  Boyfriend states that she has a history of recreational Xanax use. Patient with pinpoint pupils on arrival, sleepy and arousable and oriented.  She had an episode of emesis upon arrival but reports no nausea after this.  She denies any other complaints. Will monitor and check labwork.  Lab work significant for hCG greater than 2000, CBC unremarkable.  CMP shows elevation in AST to 181, elevation in ALT to 89. Ethanol level is 0. UDS is pending and patient refusing to give urine sample.  She was seen at MAU 2 days ago, diagnosed with a UTI and started on antibiotics.  She did have another episode of emesis after attempting PO challenge. Initially ordered phenergan but did not want this to sedate her even more so subsequently discontinued. Her nausea was controlled without intervention. She will often request to be  discharged, but then remains drowsy. She continues to maintain her airway. Will reassess.  6:09 AM RN attempted to ambulate but patient remains drowsy. I am unsure if there are any other substances on board.  6:30 AM Signed out to oncoming provider pending recheck and able ambulate without difficulty. Will discharge home with Narcan kit and f/u regarding prenatal care. Patient educated on cessation of recreational drug use.   Portions of this note were generated with Lobbyist. Dictation errors may occur  despite best attempts at proofreading.  Final Clinical Impression(s) / ED Diagnoses Final diagnoses:  Accidental drug overdose, initial encounter  Pregnancy, unspecified gestational age  Elevated liver enzymes    Rx / DC Orders ED Discharge Orders         Ordered    naloxone [facility]) nasal spray 4 mg/0.1 mL        05/28/20 0505           Delia Heady, PA-C 05/28/20 0630    Ripley Fraise, MD 05/28/20 [phone]

## 2020-05-29 LAB — GC/CHLAMYDIA PROBE AMP (~~LOC~~) NOT AT ARMC
Chlamydia: NEGATIVE
Comment: NEGATIVE
Comment: NORMAL
Neisseria Gonorrhea: NEGATIVE

## 2020-07-29 ENCOUNTER — Inpatient Hospital Stay (HOSPITAL_COMMUNITY)
Admission: AD | Admit: 2020-07-29 | Discharge: 2020-07-29 | Disposition: A | Discharge status: Home / Self Care | Payer: Self-pay | Attending: Obstetrics & Gynecology | Admitting: Obstetrics & Gynecology

## 2020-07-29 ENCOUNTER — Encounter (HOSPITAL_COMMUNITY): Payer: Self-pay | Admitting: Obstetrics & Gynecology

## 2020-07-29 ENCOUNTER — Inpatient Hospital Stay (HOSPITAL_BASED_OUTPATIENT_CLINIC_OR_DEPARTMENT_OTHER): Payer: Self-pay

## 2020-07-29 DIAGNOSIS — O9A212 Injury, poisoning and certain other consequences of external causes complicating pregnancy, second trimester: Secondary | ICD-10-CM | POA: Insufficient documentation

## 2020-07-29 DIAGNOSIS — O99322 Drug use complicating pregnancy, second trimester: Secondary | ICD-10-CM | POA: Diagnosis not present

## 2020-07-29 DIAGNOSIS — Y939 Activity, unspecified: Secondary | ICD-10-CM | POA: Diagnosis not present

## 2020-07-29 DIAGNOSIS — S0181XA Laceration without foreign body of other part of head, initial encounter: Secondary | ICD-10-CM | POA: Insufficient documentation

## 2020-07-29 DIAGNOSIS — W19XXXA Unspecified fall, initial encounter: Secondary | ICD-10-CM | POA: Diagnosis not present

## 2020-07-29 DIAGNOSIS — F1290 Cannabis use, unspecified, uncomplicated: Secondary | ICD-10-CM | POA: Insufficient documentation

## 2020-07-29 DIAGNOSIS — T1490XA Injury, unspecified, initial encounter: Secondary | ICD-10-CM | POA: Insufficient documentation

## 2020-07-29 DIAGNOSIS — Z3A15 15 weeks gestation of pregnancy: Secondary | ICD-10-CM

## 2020-07-29 DIAGNOSIS — R109 Unspecified abdominal pain: Secondary | ICD-10-CM

## 2020-07-29 DIAGNOSIS — Z87891 Personal history of nicotine dependence: Secondary | ICD-10-CM | POA: Insufficient documentation

## 2020-07-29 DIAGNOSIS — O99891 Other specified diseases and conditions complicating pregnancy: Secondary | ICD-10-CM

## 2020-07-29 DIAGNOSIS — F1590 Other stimulant use, unspecified, uncomplicated: Secondary | ICD-10-CM | POA: Insufficient documentation

## 2020-07-29 DIAGNOSIS — Z79899 Other long term (current) drug therapy: Secondary | ICD-10-CM | POA: Diagnosis not present

## 2020-07-29 DIAGNOSIS — O26892 Other specified pregnancy related conditions, second trimester: Secondary | ICD-10-CM | POA: Diagnosis not present

## 2020-07-29 DIAGNOSIS — O219 Vomiting of pregnancy, unspecified: Secondary | ICD-10-CM

## 2020-07-29 DIAGNOSIS — R112 Nausea with vomiting, unspecified: Secondary | ICD-10-CM | POA: Diagnosis not present

## 2020-07-29 DIAGNOSIS — Y929 Unspecified place or not applicable: Secondary | ICD-10-CM | POA: Diagnosis not present

## 2020-07-29 LAB — URINALYSIS, ROUTINE W REFLEX MICROSCOPIC
Bilirubin Urine: NEGATIVE
Glucose, UA: 50 mg/dL — AB
Hgb urine dipstick: NEGATIVE
Ketones, ur: 5 mg/dL — AB
Nitrite: NEGATIVE
Protein, ur: 30 mg/dL — AB
Specific Gravity, Urine: 1.021 (ref 1.005–1.030)
pH: 6 (ref 5.0–8.0)

## 2020-07-29 MED ORDER — LACTATED RINGERS IV BOLUS
1000.0000 mL | Freq: Once | INTRAVENOUS | Status: AC
  Administered 2020-07-29: 1000 mL via INTRAVENOUS

## 2020-07-29 MED ORDER — KETOROLAC TROMETHAMINE 60 MG/2ML IM SOLN
60.0000 mg | Freq: Once | INTRAMUSCULAR | Status: AC
  Administered 2020-07-29: 60 mg via INTRAMUSCULAR
  Filled 2020-07-29: qty 2

## 2020-07-29 MED ORDER — SODIUM CHLORIDE 0.9 % IV SOLN
8.0000 mg | Freq: Once | INTRAVENOUS | Status: AC
  Administered 2020-07-29: 8 mg via INTRAVENOUS
  Filled 2020-07-29: qty 4

## 2020-07-29 MED ORDER — ONDANSETRON HCL 8 MG PO TABS: 8.0000 mg | ORAL_TABLET | Freq: Every day | ORAL | 1 refills | Status: DC | PRN
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
## 2020-07-31 LAB — CULTURE, OB URINE: Culture: 10000 — AB

## 2020-08-01 ENCOUNTER — Other Ambulatory Visit: Payer: Self-pay | Admitting: Obstetrics and Gynecology

## 2020-08-01 DIAGNOSIS — R8271 Bacteriuria: Secondary | ICD-10-CM

## 2020-08-01 MED ORDER — AMOXICILLIN 500 MG PO CAPS: 500.0000 mg | ORAL_CAPSULE | Freq: Three times a day (TID) | ORAL | 0 refills | Status: AC

## 2020-08-29 ENCOUNTER — Inpatient Hospital Stay (HOSPITAL_COMMUNITY)
Admission: AD | Admit: 2020-08-29 | Discharge: 2020-08-29 | Disposition: A | Discharge status: Home / Self Care | Payer: Medicaid Other | Attending: Obstetrics and Gynecology | Admitting: Obstetrics and Gynecology

## 2020-08-29 ENCOUNTER — Other Ambulatory Visit: Payer: Self-pay

## 2020-08-29 ENCOUNTER — Encounter (HOSPITAL_COMMUNITY): Payer: Self-pay | Admitting: Obstetrics and Gynecology

## 2020-08-29 DIAGNOSIS — N760 Acute vaginitis: Secondary | ICD-10-CM

## 2020-08-29 DIAGNOSIS — O26892 Other specified pregnancy related conditions, second trimester: Secondary | ICD-10-CM | POA: Diagnosis not present

## 2020-08-29 DIAGNOSIS — O209 Hemorrhage in early pregnancy, unspecified: Secondary | ICD-10-CM | POA: Insufficient documentation

## 2020-08-29 DIAGNOSIS — Z87891 Personal history of nicotine dependence: Secondary | ICD-10-CM | POA: Insufficient documentation

## 2020-08-29 DIAGNOSIS — B9689 Other specified bacterial agents as the cause of diseases classified elsewhere: Secondary | ICD-10-CM | POA: Diagnosis not present

## 2020-08-29 DIAGNOSIS — O23592 Infection of other part of genital tract in pregnancy, second trimester: Secondary | ICD-10-CM | POA: Diagnosis not present

## 2020-08-29 DIAGNOSIS — R109 Unspecified abdominal pain: Secondary | ICD-10-CM | POA: Insufficient documentation

## 2020-08-29 DIAGNOSIS — O26899 Other specified pregnancy related conditions, unspecified trimester: Secondary | ICD-10-CM

## 2020-08-29 DIAGNOSIS — Z3A20 20 weeks gestation of pregnancy: Secondary | ICD-10-CM | POA: Insufficient documentation

## 2020-08-29 LAB — WET PREP, GENITAL
Sperm: NONE SEEN
Trich, Wet Prep: NONE SEEN
Yeast Wet Prep HPF POC: NONE SEEN

## 2020-08-29 MED ORDER — ACETAMINOPHEN 500 MG PO TABS
1000.0000 mg | ORAL_TABLET | Freq: Once | ORAL | Status: AC
  Administered 2020-08-29: 1000 mg via ORAL
  Filled 2020-08-29: qty 2

## 2020-08-29 MED ORDER — METRONIDAZOLE 500 MG PO TABS: 500.0000 mg | ORAL_TABLET | Freq: Two times a day (BID) | ORAL | 0 refills | Status: DC

## 2020-08-29 NOTE — Discharge Instructions (Signed)
Bacterial Vaginosis  Bacterial vaginosis is a vaginal infection that occurs when the normal balance of bacteria in the vagina is disrupted. It results from an overgrowth of certain bacteria. This is the most common vaginal infection among women ages 15-44. Because bacterial vaginosis increases your risk for STIs (sexually transmitted infections), getting treated can help reduce your risk for chlamydia, gonorrhea, herpes, and HIV (human immunodeficiency virus). Treatment is also important for preventing complications in pregnant women, because this condition can cause an early (premature) delivery. What are the causes? This condition is caused by an increase in harmful bacteria that are normally present in small amounts in the vagina. However, the reason that the condition develops is not fully understood. What increases the risk? The following factors may make you more likely to develop this condition:  Having a new sexual partner or multiple sexual partners.  Having unprotected sex.  Douching.  Having an intrauterine device (IUD).  Smoking.  Drug and alcohol abuse.  Taking certain antibiotic medicines.  Being pregnant. You cannot get bacterial vaginosis from toilet seats, bedding, swimming pools, or contact with objects around you. What are the signs or symptoms? Symptoms of this condition include:  Grey or white vaginal discharge. The discharge can also be watery or foamy.  A fish-like odor with discharge, especially after sexual intercourse or during menstruation.  Itching in and around the vagina.  Burning or pain with urination. Some women with bacterial vaginosis have no signs or symptoms. How is this diagnosed? This condition is diagnosed based on:  Your medical history.  A physical exam of the vagina.  Testing a sample of vaginal fluid under a microscope to look for a large amount of bad bacteria or abnormal cells. Your health care provider may use a cotton swab or  a small wooden spatula to collect the sample. How is this treated? This condition is treated with antibiotics. These may be given as a pill, a vaginal cream, or a medicine that is put into the vagina (suppository). If the condition comes back after treatment, a second round of antibiotics may be needed. Follow these instructions at home: Medicines  Take over-the-counter and prescription medicines only as told by your health care provider.  Take or use your antibiotic as told by your health care provider. Do not stop taking or using the antibiotic even if you start to feel better. General instructions  If you have a female sexual partner, tell her that you have a vaginal infection. She should see her health care provider and be treated if she has symptoms. If you have a female sexual partner, he does not need treatment.  During treatment: ? Avoid sexual activity until you finish treatment. ? Do not douche. ? Avoid alcohol as directed by your health care provider. ? Avoid breastfeeding as directed by your health care provider.  Drink enough water and fluids to keep your urine clear or pale yellow.  Keep the area around your vagina and rectum clean. ? Wash the area daily with warm water. ? Wipe yourself from front to back after using the toilet.  Keep all follow-up visits as told by your health care provider. This is important. How is this prevented?  Do not douche.  Wash the outside of your vagina with warm water only.  Use protection when having sex. This includes latex condoms and dental dams.  Limit how many sexual partners you have. To help prevent bacterial vaginosis, it is best to have sex with just one partner (  monogamous).  Make sure you and your sexual partner are tested for STIs.  Wear cotton or cotton-lined underwear.  Avoid wearing tight pants and pantyhose, especially during summer.  Limit the amount of alcohol that you drink.  Do not use any products that contain  nicotine or tobacco, such as cigarettes and e-cigarettes. If you need help quitting, ask your health care provider.  Do not use illegal drugs. Where to find more information  Centers for Disease Control and Prevention: www.cdc.gov/std  American Sexual Health Association (ASHA): www.ashastd.org  U.S. Department of Health and Human Services, Office on Women's Health: www.womenshealth.gov/ or https://www.womenshealth.gov/a-z-topics/bacterial-vaginosis Contact a health care provider if:  Your symptoms do not improve, even after treatment.  You have more discharge or pain when urinating.  You have a fever.  You have pain in your abdomen.  You have pain during sex.  You have vaginal bleeding between periods. Summary  Bacterial vaginosis is a vaginal infection that occurs when the normal balance of bacteria in the vagina is disrupted.  Because bacterial vaginosis increases your risk for STIs (sexually transmitted infections), getting treated can help reduce your risk for chlamydia, gonorrhea, herpes, and HIV (human immunodeficiency virus). Treatment is also important for preventing complications in pregnant women, because the condition can cause an early (premature) delivery.  This condition is treated with antibiotic medicines. These may be given as a pill, a vaginal cream, or a medicine that is put into the vagina (suppository). This information is not intended to replace advice given to you by your health care provider. Make sure you discuss any questions you have with your health care provider. Document Revised: 09/08/2017 Document Reviewed: 06/11/2016 Elsevier Patient Education  2020 [employer]vier Inc.  
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
## 2020-08-31 LAB — GC/CHLAMYDIA PROBE AMP (~~LOC~~) NOT AT ARMC
Chlamydia: NEGATIVE
Comment: NEGATIVE
Comment: NORMAL
Neisseria Gonorrhea: NEGATIVE

## 2020-09-17 ENCOUNTER — Ambulatory Visit (INDEPENDENT_AMBULATORY_CARE_PROVIDER_SITE_OTHER): Payer: Self-pay | Admitting: Obstetrics and Gynecology

## 2020-09-17 ENCOUNTER — Encounter: Payer: Self-pay | Admitting: Obstetrics and Gynecology

## 2020-09-17 ENCOUNTER — Other Ambulatory Visit (HOSPITAL_COMMUNITY)
Admission: RE | Admit: 2020-09-17 | Discharge: 2020-09-17 | Disposition: A | Discharge status: Home / Self Care | Payer: Medicaid Other | Source: Ambulatory Visit | Attending: Advanced Practice Midwife | Admitting: Advanced Practice Midwife

## 2020-09-17 ENCOUNTER — Other Ambulatory Visit: Payer: Self-pay

## 2020-09-17 VITALS — BP 113/72 | HR 74 | Wt 152.8 lb

## 2020-09-17 DIAGNOSIS — O0992 Supervision of high risk pregnancy, unspecified, second trimester: Secondary | ICD-10-CM | POA: Insufficient documentation

## 2020-09-17 DIAGNOSIS — O0993 Supervision of high risk pregnancy, unspecified, third trimester: Secondary | ICD-10-CM | POA: Insufficient documentation

## 2020-09-17 DIAGNOSIS — Z3480 Encounter for supervision of other normal pregnancy, unspecified trimester: Secondary | ICD-10-CM

## 2020-09-17 DIAGNOSIS — Z3A22 22 weeks gestation of pregnancy: Secondary | ICD-10-CM

## 2020-09-17 DIAGNOSIS — R8271 Bacteriuria: Secondary | ICD-10-CM

## 2020-09-17 DIAGNOSIS — O0930 Supervision of pregnancy with insufficient antenatal care, unspecified trimester: Secondary | ICD-10-CM | POA: Insufficient documentation

## 2020-09-17 DIAGNOSIS — O2341 Unspecified infection of urinary tract in pregnancy, first trimester: Secondary | ICD-10-CM

## 2020-09-17 DIAGNOSIS — Z9189 Other specified personal risk factors, not elsewhere classified: Secondary | ICD-10-CM

## 2020-09-17 DIAGNOSIS — F1210 Cannabis abuse, uncomplicated: Secondary | ICD-10-CM | POA: Insufficient documentation

## 2020-09-17 MED ORDER — ONDANSETRON 4 MG PO TBDP: 4.0000 mg | ORAL_TABLET | Freq: Four times a day (QID) | ORAL | 2 refills | Status: DC | PRN

## 2020-09-17 MED ORDER — PREPLUS 27-1 MG PO TABS: 1.0000 | ORAL_TABLET | Freq: Every day | ORAL | 1 refills | Status: DC

## 2020-09-17 NOTE — Addendum Note (Signed)
Addended by: Leola Brazil on: 09/17/2020 09:27 AM   Modules accepted: Orders

## 2020-09-17 NOTE — Progress Notes (Signed)
New OB Note  09/17/2020   [facility]: [facility]  Chief Complaint: NOB  Transfer of Care Patient: no  History of Present Illness: Kristin Bates is a 19 y.o. G2P1001 @ 22/5 weeks (EDC 4/9, based on 6wk u/s).  Preg complicated by has Urinary tract infection in pregnancy, antepartum, first trimester; History of drug overdose; GBS bacteriuria; Late prenatal care; Supervision of other normal pregnancy, antepartum; Marijuana abuse; and Supervision of high risk pregnancy in second trimester on their problem list.   Any events prior to today's visit: history of drug use and suspected overdose in the first trimester She was using no method when she conceived.  She has mild signs or symptoms of nausea/vomiting of pregnancy. She has Negative signs or symptoms of miscarriage or preterm labor  ROS: A 12-point review of systems was performed and negative, except as stated in the above HPI.  OBGYN History: As per HPI. OB History  Gravida Para Term Preterm AB Living  2 1 1     1   SAB IAB Ectopic Multiple Live Births        0 1    # Outcome Date GA Lbr Len/2nd Weight Sex Delivery Anes PTL Lv  2 Current           1 Term 06/29/16 [redacted]w[redacted]d 11:25 / 00:30 8 lb 4.1 oz (3.745 kg) M Vag-Spont EPI  LIV    Any issues with any prior pregnancies: no Prior children are healthy, doing well, and without any problems or issues: yes History of pap smears: No.   Past Medical History: Past Medical History:  Diagnosis Date  . Drug overdose 05/27/2020   pt does not know what she tood  . Infection    UTI    Past Surgical History: Past Surgical History:  Procedure Laterality Date  . NO PAST SURGERIES      Family History:  Family History  Problem Relation Age of Onset  . Healthy Mother   . Heart disease Father 2       Heart attack   She denies any history of mental retardation, birth defects or genetic disorders in her or the FOB's history  Social History:  Social History    Socioeconomic History  . Marital status: Single    Spouse name: Not on file  . Number of children: Not on file  . Years of education: Not on file  . Highest education level: Not on file  Occupational History  . Not on file  Tobacco Use  . Smoking status: Former Smoker    Quit date: 09/09/2015    Years since quitting: 5.0  . Smokeless tobacco: Never Used  Vaping Use  . Vaping Use: Never used  Substance and Sexual Activity  . Alcohol use: Not Currently    Comment: social   . Drug use: Yes    Types: Marijuana, IV, Benzodiazepines    Comment: says stopped with +preg, was smoking daily prior states was taking xanax every 3 days  . Sexual activity: Yes    Birth control/protection: None  Other Topics Concern  . Not on file  Social History Narrative  . Not on file   Social Determinants of Health   Financial Resource Strain: Not on file  Food Insecurity: Not on file  Transportation Needs: Not on file  Physical Activity: Not on file  Stress: Not on file  Social Connections: Not on file  Intimate Partner Violence: Not on file    Allergy: No Known Allergies  Health Maintenance:  Mammogram Up to Date: not applicable  Current Outpatient Medications: Prenatal vitamin zofran odt every morning  Physical Exam:   BP 113/72   Pulse 74   Wt 152 lb 12.8 oz (69.3 kg)   LMP 03/25/2020   BMI 26.23 kg/m  Body mass index is 26.23 kg/m. Contractions: Not present Vag. Bleeding: None. Fundal height: 23 FHTs: 130s  General appearance: Well nourished, well developed female in no acute distress.  Neck:  Supple, normal appearance, and no thyromegaly  Cardiovascular: S1, S2 normal, no murmur, rub or gallop, regular rate and rhythm Respiratory:  Clear to auscultation bilateral. Normal respiratory effort Abdomen: positive bowel sounds and no masses, hernias; diffusely non tender to palpation, non distended Breasts: patient denies any breast. Neuro/Psych:  Normal mood and affect.   Skin:  Warm and dry.  Lymphatic:  No inguinal lymphadenopathy.   Pelvic exam: is not limited by body habitus EGBUS: within normal limits, Vagina: within normal limits and with no blood in the vault, Cervix: normal appearing cervix without discharge or lesions, closed/long/high, Uterus:  enlarged, c/w 22 week size, and Adnexa:  normal adnexa and no mass, fullness, tenderness  Laboratory: reviewed  Imaging:  Narrative & Impression  CLINICAL DATA:  Overdose.  EXAM: OBSTETRIC <14 WK [place] AND TRANSVAGINAL OB US  TECHNIQUE: Both transabdominal and transvaginal ultrasound examinations were performed for complete evaluation of the gestation as well as the maternal uterus, adnexal regions, and pelvic cul-de-sac. Transvaginal technique was performed to assess early pregnancy.  COMPARISON:  02/27/2018.  FINDINGS: Intrauterine gestational sac: Single  Yolk sac:  Present  Embryo:  Present  Cardiac Activity: Present  Heart Rate: 112 bpm  CRL: 8.7 mm   6 w   5 d                  [place] EDC: 01/16/2021  Subchorionic hemorrhage:  None visualized.  Maternal uterus/adnexae: Small corpus luteal cyst right ovary. No free pelvic fluid.  IMPRESSION: 1. Single viable intrauterine pregnancy at 6 weeks 5 days. Fetal heart rate 112 beats per minute.  2.  Small right ovarian corpus luteal cyst.  No free pelvic fluid.   Electronically Signed   By: Maisie Fus  Register   On: 05/28/2020 11:34    Assessment: pt doing well  Plan: 1. Supervision of other normal pregnancy, antepartum  2. Supervision of high risk pregnancy in second trimester Routine care. Sue Lush SW CC'ed on note. Declined covid shot and flu shot today. Declines genetics - Cytology - PAP - CBC/D/Plt+RPR+Rh+ABO+Rub Ab... - 284132 11+Oxyco+Alc+Crt-Bund - [place] MFM OB DETAIL +14 WK; Future  3. [redacted] weeks gestation of pregnancy  4. Marijuana abuse D/w her re: cessation benefits  5. GBS bacteriuria tx in labor  6.  Urinary tract infection in pregnancy, antepartum, first trimester toc neg  7. History of drug overdose Used heroin (nasal) last when we saw her in the first trimester at 6weeks. Congratulated on staying clean. Screening UDS  8. Late prenatal care  Problem list reviewed and updated.  Follow up in 3 weeks.  The nature of [facility] with multiple MDs and other Advanced Practice Providers was explained to patient; also emphasized that residents, students are part of our team.  >50% of 30 min visit spent on counseling and coordination of care.     Cornelia Copa MD Attending [facility])

## 2020-09-18 ENCOUNTER — Encounter: Payer: Self-pay | Admitting: Obstetrics and Gynecology

## 2020-09-18 LAB — HCV INTERPRETATION

## 2020-09-18 LAB — CBC/D/PLT+RPR+RH+ABO+RUB AB...
Antibody Screen: NEGATIVE
Basophils Absolute: 0 10*3/uL (ref 0.0–0.2)
Basos: 1 %
EOS (ABSOLUTE): 0.1 10*3/uL (ref 0.0–0.4)
Eos: 2 %
HCV Ab: <0.1 s/co ratio (ref 0.0–0.9)
HIV Screen 4th Generation wRfx: NONREACTIVE
Hematocrit: 36.2 % (ref 34.0–46.6)
Hemoglobin: 12.2 g/dL (ref 11.1–15.9)
Hepatitis B Surface Ag: NEGATIVE
Immature Grans (Abs): 0 10*3/uL (ref 0.0–0.1)
Immature Granulocytes: 1 %
Lymphocytes Absolute: 1.9 10*3/uL (ref 0.7–3.1)
Lymphs: 34 %
MCH: 32.8 pg (ref 26.6–33.0)
MCHC: 33.7 g/dL (ref 31.5–35.7)
MCV: 97 fL (ref 79–97)
Monocytes Absolute: 0.5 10*3/uL (ref 0.1–0.9)
Monocytes: 8 %
Neutrophils Absolute: 3.1 10*3/uL (ref 1.4–7.0)
Neutrophils: 54 %
Platelets: 220 10*3/uL (ref 150–450)
RBC: 3.72 x10E6/uL — ABNORMAL LOW (ref 3.77–5.28)
RDW: 11.4 % — ABNORMAL LOW (ref 11.7–15.4)
RPR Ser Ql: NONREACTIVE
Rh Factor: POSITIVE
Rubella Antibodies, IGG: <0.9 index — ABNORMAL LOW (ref >0.99)
WBC: 5.6 10*3/uL (ref 3.4–10.8)

## 2020-09-18 LAB — CYTOLOGY - PAP: Diagnosis: NEGATIVE

## 2020-09-18 LAB — TSH: TSH: 3.69 u[IU]/mL (ref 0.450–4.500)

## 2020-09-24 ENCOUNTER — Encounter: Payer: Self-pay | Admitting: Radiology

## 2020-09-24 ENCOUNTER — Telehealth: Payer: Self-pay | Admitting: Radiology

## 2020-09-24 NOTE — Telephone Encounter (Signed)
Called patient to inform of Panorama results, no answer and unable to leave voicemail, sent mychart message and scanned results

## 2020-09-30 ENCOUNTER — Encounter: Payer: Self-pay | Admitting: Advanced Practice Midwife

## 2020-09-30 LAB — DRUG SCREEN 764883 11+OXYCO+ALC+CRT-BUND
Amphetamines, Urine: NEGATIVE ng/mL
Barbiturate: NEGATIVE ng/mL
Creatinine: 120.2 mg/dL (ref 20.0–300.0)
Ethanol: NEGATIVE %
Meperidine: NEGATIVE ng/mL
Methadone Screen, Urine: NEGATIVE ng/mL
OPIATE SCREEN URINE: NEGATIVE ng/mL
Oxycodone/Oxymorphone, Urine: NEGATIVE ng/mL
Phencyclidine: NEGATIVE ng/mL
Propoxyphene: NEGATIVE ng/mL
Tramadol: NEGATIVE ng/mL
pH, Urine: 5.9 (ref 4.5–8.9)

## 2020-09-30 LAB — CANNABINOID CONFIRMATION, UR
CANNABINOIDS: POSITIVE — AB
Carboxy THC GC/MS Conf: 204 ng/mL

## 2020-09-30 LAB — BENZODIAZEPINES CONFIRM, URINE
Alprazolam Conf.: 135 ng/mL
Alprazolam: POSITIVE — AB
Benzodiazepines: POSITIVE ng/mL — AB
Clonazepam: NEGATIVE
Flurazepam: NEGATIVE
Lorazepam: NEGATIVE
Midazolam: NEGATIVE
Nordiazepam: NEGATIVE
Oxazepam: NEGATIVE
Temazepam: NEGATIVE
Triazolam: NEGATIVE

## 2020-09-30 LAB — COCAINE CONF, UR
Benzoylecgonine GC/MS Conf: 10640 ng/mL
Cocaine Metab Quant, Ur: POSITIVE — AB

## 2020-10-08 ENCOUNTER — Encounter: Payer: Self-pay | Admitting: Family Medicine

## 2020-10-10 NOTE — L&D Delivery Note (Signed)
Delivery Note Delivery was imminent and performed by Claudette Head.  At 9:24 AM a viable female was delivered via Vaginal, Spontaneous (Presentation: Left Occiput Anterior).  APGAR: 9, 9; weight 8 lb 1.3 oz (3666 g).  I was present to assist with delivery of the placenta.  Placenta status: Spontaneous, Intact.  Cord: 3 vessels with the following complications: None.  Cord pH: n/a  Uterus was firm.  Small perineal laceration noted, not actively bleeding.  Plan to transfer [facility] to [facility] for further evaluation and assessment.  Anesthesia: None Episiotomy: None Est. Blood Loss (mL): 350  Mom to postpartum.  Baby to Couplet care / Skin to Skin.  Alessandra Bevels Harlene Petralia 01/07/2021, 1:15 PM

## 2020-10-14 ENCOUNTER — Encounter: Payer: Self-pay | Admitting: Advanced Practice Midwife

## 2020-10-15 ENCOUNTER — Ambulatory Visit: Payer: Medicaid Other

## 2020-10-15 ENCOUNTER — Ambulatory Visit: Payer: Medicaid Other | Attending: Obstetrics and Gynecology

## 2020-10-22 ENCOUNTER — Encounter: Payer: Self-pay | Admitting: Obstetrics and Gynecology

## 2020-10-23 ENCOUNTER — Encounter: Payer: Medicaid Other | Admitting: Obstetrics and Gynecology

## 2020-10-23 ENCOUNTER — Other Ambulatory Visit: Payer: Medicaid Other

## 2020-11-17 ENCOUNTER — Ambulatory Visit (INDEPENDENT_AMBULATORY_CARE_PROVIDER_SITE_OTHER): Payer: Medicaid Other | Admitting: Obstetrics and Gynecology

## 2020-11-17 ENCOUNTER — Other Ambulatory Visit: Payer: Self-pay

## 2020-11-17 ENCOUNTER — Other Ambulatory Visit: Payer: Medicaid Other

## 2020-11-17 VITALS — BP 108/72 | HR 92 | Wt 161.6 lb

## 2020-11-17 DIAGNOSIS — O0930 Supervision of pregnancy with insufficient antenatal care, unspecified trimester: Secondary | ICD-10-CM

## 2020-11-17 DIAGNOSIS — O0993 Supervision of high risk pregnancy, unspecified, third trimester: Secondary | ICD-10-CM

## 2020-11-17 DIAGNOSIS — Z9189 Other specified personal risk factors, not elsewhere classified: Secondary | ICD-10-CM

## 2020-11-17 DIAGNOSIS — Z23 Encounter for immunization: Secondary | ICD-10-CM

## 2020-11-17 DIAGNOSIS — O219 Vomiting of pregnancy, unspecified: Secondary | ICD-10-CM

## 2020-11-17 DIAGNOSIS — O0933 Supervision of pregnancy with insufficient antenatal care, third trimester: Secondary | ICD-10-CM | POA: Insufficient documentation

## 2020-11-17 DIAGNOSIS — F1210 Cannabis abuse, uncomplicated: Secondary | ICD-10-CM

## 2020-11-17 DIAGNOSIS — O0992 Supervision of high risk pregnancy, unspecified, second trimester: Secondary | ICD-10-CM

## 2020-11-17 DIAGNOSIS — Z283 Underimmunization status: Secondary | ICD-10-CM

## 2020-11-17 DIAGNOSIS — Z3A31 31 weeks gestation of pregnancy: Secondary | ICD-10-CM | POA: Diagnosis not present

## 2020-11-17 DIAGNOSIS — Z2839 Other underimmunization status: Secondary | ICD-10-CM

## 2020-11-17 DIAGNOSIS — O2341 Unspecified infection of urinary tract in pregnancy, first trimester: Secondary | ICD-10-CM

## 2020-11-17 DIAGNOSIS — O99891 Other specified diseases and conditions complicating pregnancy: Secondary | ICD-10-CM

## 2020-11-17 MED ORDER — ONDANSETRON 4 MG PO TBDP: 4.0000 mg | ORAL_TABLET | Freq: Four times a day (QID) | ORAL | 2 refills | Status: DC | PRN

## 2020-11-17 NOTE — Progress Notes (Signed)
   PRENATAL VISIT NOTE  Subjective:  Kristin Bates is a 20 y.o. G2P1001 at [redacted]w[redacted]d being seen today for ongoing prenatal care.  She is currently monitored for the following issues for this high-risk pregnancy and has Nausea and vomiting during pregnancy; Rubella non-immune status, antepartum; Urinary tract infection in pregnancy, antepartum, first trimester; History of drug overdose; Late prenatal care; Marijuana abuse; Supervision of high risk pregnancy in second trimester; [redacted] weeks gestation of pregnancy; and Limited prenatal care, third trimester on their problem list.  Patient doing well with no acute concerns today. She reports nausea.  Contractions: Not present. Vag. Bleeding: None.  Movement: Present. Denies leaking of fluid.   Pt is receiving her third trimester labs and 2 hour GTT today.  She appears very jittery and axious today.  There is a strong smell of marijuana in the room.  She admits to continued marijuana use, but denies any other drugs.  Pt advised that stopping the marijuana may actually decrease her nausea and the patient agrees.  The following portions of the patient's history were reviewed and updated as appropriate: allergies, current medications, past family history, past medical history, past social history, past surgical history and problem list. Problem list updated.  Objective:   Vitals:   11/17/20 0926  BP: 108/72  Pulse: 92  Weight: 161 lb 9.6 oz (73.3 kg)    Fetal Status: Fetal Heart Rate (bpm): 145 Fundal Height: 31 cm Movement: Present     General:  Alert, oriented and cooperative. Patient is in no acute distress.  Skin: Skin is warm and dry. No rash noted.   Cardiovascular: Normal heart rate noted  Respiratory: Normal respiratory effort, no problems with respiration noted  Abdomen: Soft, gravid, appropriate for gestational age.  Pain/Pressure: Absent     Pelvic: Cervical exam deferred        Extremities: Normal range of motion.  Edema: None  Mental  Status:  Normal mood and affect. Normal behavior. Normal judgment and thought content.   Assessment and Plan:  Pregnancy: G2P1001 at [redacted]w[redacted]d  1. Urinary tract infection in pregnancy, antepartum, first trimester No s/sx of uti  2. Marijuana abuse Pt advised to stop THC usage as it may be affecting her nauasea - ToxASSURE Select 12(MW)  3. Supervision of high risk pregnancy in third trimester 2 hour GTT today with labs Pt will receive flu shot, currently unsure about covid vaccination - [place] MFM OB DETAIL +14 WK; Future  4. Late prenatal care Anatomy scan rescheduled  5. History of drug overdose UDS today  6. Rubella non-immune status, antepartum Vaccinate after delivery  7. [redacted] weeks gestation of pregnancy   8. Limited prenatal care, third trimester  - [place] MFM OB DETAIL +14 WK; Future  9. Nausea and vomiting during pregnancy rx for zofran, pt advised to stop MJ  Preterm labor symptoms and general obstetric precautions including but not limited to vaginal bleeding, contractions, leaking of fluid and fetal movement were reviewed in detail with the patient.  Please refer to After Visit Summary for other counseling recommendations.   Return in about 2 weeks (around 12/01/2020) for [facility], in person.   Mariel Aloe, MD

## 2020-11-17 NOTE — Addendum Note (Signed)
Addended by: Lorelle Gibbs L on: 11/17/2020 10:27 AM   Modules accepted: Orders

## 2020-11-17 NOTE — Patient Instructions (Addendum)
Third Trimester of Pregnancy  The third trimester of pregnancy is from week 28 through week 40. This is also called months 7 through 9. This trimester is when your unborn baby (fetus) is growing very fast. At the end of the ninth month, the unborn baby is about 20 inches long. It weighs about 6-10 pounds. Body changes during your third trimester Your body continues to go through many changes during this time. The changes vary and generally return to normal after the baby is born. Physical changes  Your weight will continue to increase. You may gain 25-35 pounds (11-16 kg) by the end of the pregnancy. If you are underweight, you may gain 28-40 lb (about 13-18 kg). If you are overweight, you may gain 15-25 lb (about 7-11 kg).  You may start to get stretch marks on your hips, belly (abdomen), and breasts.  Your breasts will continue to grow and may hurt. A yellow fluid (colostrum) may leak from your breasts. This is the first milk you are making for your baby.  You may have changes in your hair.  Your belly button may stick out.  You may have more swelling in your hands, face, or ankles. Health changes  You may have heartburn.  You may have trouble pooping (constipation).  You may get hemorrhoids. These are swollen veins in the butt that can itch or get painful.  You may have swollen veins (varicose veins) in your legs.  You may have more body aches in the pelvis, back, or thighs.  You may have more tingling or numbness in your hands, arms, and legs. The skin on your belly may also feel numb.  You may feel short of breath as your womb (uterus) gets bigger. Other changes  You may pee (urinate) more often.  You may have more problems sleeping.  You may notice the unborn baby "dropping," or moving lower in your belly.  You may have more discharge coming from your vagina.  Your joints may feel loose, and you may have pain around your pelvic bone. Follow these instructions at  home: Medicines  Take over-the-counter and prescription medicines only as told by your doctor. Some medicines are not safe during pregnancy.  Take a prenatal vitamin that contains at least 600 micrograms (mcg) of folic acid. Eating and drinking  Eat healthy meals that include: ? Fresh fruits and vegetables. ? Whole grains. ? Good sources of protein, such as meat, eggs, or tofu. ? Low-fat dairy products.  Avoid raw meat and unpasteurized juice, milk, and cheese. These carry germs that can harm you and your baby.  Eat 4 or 5 small meals rather than 3 large meals a day.  You may need to take these actions to prevent or treat trouble pooping: ? Drink enough fluids to keep your pee (urine) pale yellow. ? Eat foods that are high in fiber. These include beans, whole grains, and fresh fruits and vegetables. ? Limit foods that are high in fat and sugar. These include fried or sweet foods. Activity  Exercise only as told by your doctor. Stop exercising if you start to have cramps in your womb.  Avoid heavy lifting.  Do not exercise if it is too hot or too humid, or if you are in a place of great height (high altitude).  If you choose to, you may have sex unless your doctor tells you not to. Relieving pain and discomfort  Take breaks often, and rest with your legs raised (elevated) if you have   leg cramps or low back pain.  Take warm water baths (sitz baths) to soothe pain or discomfort caused by hemorrhoids. Use hemorrhoid cream if your doctor approves.  Wear a good support bra if your breasts are tender.  If you develop bulging, swollen veins in your legs: ? Wear support hose as told by your doctor. ? Raise your feet for 15 minutes, 3-4 times a day. ? Limit salt in your food. Safety  Talk to your doctor before traveling far distances.  Do not use hot tubs, steam rooms, or saunas.  Wear your seat belt at all times when you are in a car.  Talk with your doctor if someone is  hurting you or yelling at you a lot. Preparing for your baby's arrival To prepare for the arrival of your baby:  Take prenatal classes.  Visit the hospital and tour the maternity area.  Buy a rear-facing car seat. Learn how to install it in your car.  Prepare the baby's room. Take out all pillows and stuffed animals from the baby's crib. General instructions  Avoid cat litter boxes and soil used by cats. These carry germs that can cause harm to the baby and can cause a loss of your baby by miscarriage or stillbirth.  Do not douche or use tampons. Do not use scented sanitary pads.  Do not smoke or use any products that contain nicotine or tobacco. If you need help quitting, ask your doctor.  Do not drink alcohol.  Do not use herbal medicines, illegal drugs, or medicines that were not approved by your doctor. Chemicals in these products can affect your baby.  Keep all follow-up visits. This is important. Where to find more information  American Pregnancy Association: americanpregnancy.org  American College of Obstetricians and Gynecologists: www.acog.org  Office on Women's Health: womenshealth.gov/pregnancy Contact a doctor if:  You have a fever.  You have mild cramps or pressure in your lower belly.  You have a nagging pain in your belly area.  You vomit, or you have watery poop (diarrhea).  You have bad-smelling fluid coming from your vagina.  You have pain when you pee, or your pee smells bad.  You have a headache that does not go away when you take medicine.  You have changes in how you see, or you see spots in front of your eyes. Get help right away if:  Your water breaks.  You have regular contractions that are less than 5 minutes apart.  You are spotting or bleeding from your vagina.  You have very bad belly cramps or pain.  You have trouble breathing.  You have chest pain.  You faint.  You have not felt the baby move for the amount of time told by  your doctor.  You have new or increased pain, swelling, or redness in an arm or leg. Summary  The third trimester is from week 28 through week 40 (months 7 through 9). This is the time when your unborn baby is growing very fast.  During this time, your discomfort may increase as you gain weight and as your baby grows.  Get ready for your baby to arrive by taking prenatal classes, buying a rear-facing car seat, and preparing the baby's room.  Get help right away if you are bleeding from your vagina, you have chest pain and trouble breathing, or you have not felt the baby move for the amount of time told by your doctor. This information is not intended to replace advice given   to you by your health care provider. Make sure you discuss any questions you have with your health care provider. Document Revised: 03/04/2020 Document Reviewed: 01/09/2020 Elsevier Patient Education  2021 Elsevier Inc.  Pregnancy and COVID-19 Pregnant women and women who were recently pregnant are at an increased risk for severe illness from COVID-19. Other conditions, such as being pregnant at an older age or having diabetes or obesity, can further increase the risk of severe illness from COVID-19. This risk can last for at least 42 days following the end of the pregnancy. Protect yourself and your baby by: Knowing your risk factors. Ask your health care provider about your specific risk factors. Working with your health care team to protect yourself against all infections, including COVID-19. How does COVID-19 affect me? If you get COVID-19 while pregnant or shortly after your pregnancy, there is an increased risk that you may: Get a respiratory illness that can lead to pneumonia or severe illness. Give birth to your baby before 37 weeks of pregnancy (preterm birth). Have other complications that can affect your pregnancy. How does COVID-19 affect my care? If you have COVID-19, special precautions will be taken  around your pregnancy: You will have to notify the clinic or hospital before a visit. Steps will be taken to protect other people from the virus, including seeing you in a special room. Tests and scans may be done differently before delivery (prenatal care). Your birth plan may change, including what room you will be in and who may be with you during labor and delivery. You may stay longer in the hospital after delivery (postpartum care). COVID-19 will affect where your baby will stay after delivery. Ask about the risks and benefits of staying in the same room with your baby. Benefits include breastfeeding and mother-newborn bonding. You may have to feed your baby differently. Visitors will be limited after your baby is born. How does COVID-19 affect my baby? It is very rare for a mother with COVID-19 to pass the virus to the unborn baby. After birth, a baby can get the virus if he or she is exposed to it. Ask your health care provider about ways to protect your baby. The baby can be placed in an incubator. A physical barrier can also be used. What can I do to lower my risk? Medicines and vaccines You can receive a COVID-19 vaccination. This can protect you from severe illness. If you have concerns, talk to your health care provider. Get other recommended vaccines, including the flu vaccine and the whooping cough (Tdap) vaccine. Ask your health care provider if you can get a 30-day, or longer, supply of your medicines, so you can make fewer trips to the pharmacy. If you have received a COVID-19 vaccine, consider enrolling in the v-safe program from the CDC. This program uses an app on your smartphone to provide check-ins and gather information on your health after you receive the vaccine. There is a separate registry for pregnant women. For more information, visit: V-safe tool: https://www.patterson-winters.biz/ V-safe pregnancy registry: https://gomez-solis.com/ Cleaning and personal hygiene If  you are in isolation for COVID-19 and are sharing a room with your newborn, take these steps to reduce the risk of spreading the virus to your newborn: Wash your hands with soap and water for at least 20 seconds before holding or caring for your baby. If soap and water are not available, use alcohol-based hand sanitizer. Wear a mask when within 6 feet (1.8 m) of your baby. Keep your  baby more than 6 feet (1.8 m) away from you as much as possible. Avoid touching your mouth, face, eyes, or nose before washing your hands. Clean and disinfect objects and surfaces that are frequently touched.   Other things to do Avoid people who might have been exposed to or infected with COVID-19, including people who live with you. Cover your mouth and nose by wearing a mask or other cloth covering over your face when you go out in public. Avoid people who are not wearing a mask. Avoid large crowds. Maintain at least 6 feet (1.8 m) between yourself and others. Avoid poorly ventilated spaces. Call your health care provider if you have any health concerns. Contact your health care provider right away if you think you have COVID-19. Tell your health care provider that you think you may have a COVID-19 infection and that you are pregnant. Breastfeeding tips Plan with your family and health care team how to feed your baby. Current research shows that the virus may not pass to a baby through breast milk. If you are breastfeeding, you can receive a COVID-19 vaccine. The vaccines pose no risk for breastfeeding mothers or their babies. Some of the vaccines might create antibodies in breast milk. These antibodies can help to protect your baby. Take precautions if you have or may have COVID-19. Precautions include: Washing hands with soap and water for at least 20 seconds before feeding your baby. If soap and water are not available, use alcohol-based hand sanitizer. Wearing a mask while feeding your baby. Pumping or  expressing breast milk to feed to your baby. If possible, ask someone in your household who is not sick to feed your baby the expressed breast milk. Wash your hands with soap and water for at least 20 seconds before touching pump parts. Wash and disinfect all pump parts after expressing milk. Follow the manufacturer's instructions to clean and disinfect all pump parts. Follow these instructions: Managing stress Some pregnant and postpartum women may have fear, uncertainty, and stress because of COVID-19. Find ways to manage stress. These may include: Using relaxation techniques such as meditation and deep breathing. Getting regular exercise. Most women can continue their usual exercise routine during pregnancy. Ask your health care provider what activities are safe for you. Seeking support from family, friends, or spiritual resources. If you cannot be together in person, you can still connect by phone calls, texts, video calls, or online messaging. Doing relaxing activities that you enjoy, such as listening to music or reading a good book. General instructions Follow your health care provider's instructions on taking medicines. Some medicines may not be safe to take during pregnancy. Ask for help if you have counseling or nutritional needs. Your health care provider can offer advice or refer you to resources or specialists who can help you with various needs. Keep all follow-up visits. This is important. This includes visits before and after you have your baby. Questions to ask your health care team What should I do if I have COVID-19 symptoms? What are the side effects that can occur after receiving any of the available COVID-19 vaccines? How will COVID-19 affect my prenatal care visits, tests and scans, labor and delivery, and postpartum care? What are the risks of COVID-19 to me and the potential risks to my unborn baby or infant? How do vaccines pass antibodies to my unborn baby? Should I  plan to breastfeed my baby? Where can I find mental health resources? Where can I find support if  I have financial concerns? Where to find more information CDC: KVTVnet.com.cy World Health Organization [facility]): CommodityPost.es [employer] of Obstetricians and Gynecologists (ACOG): www.acog.org Contact a health care provider if: You have signs and symptoms of infection, including a fever or cough. Tell your health care team that you think you may have a COVID-19 infection and that you are pregnant. You have strong emotions, such as sadness or anxiety. You feel unsafe in your home and need help finding a safe place to live. You have bloody or watery vaginal discharge or vaginal bleeding. Get help right away if: You have signs or symptoms of labor before 37 weeks of pregnancy. These include: Contractions that are 5 minutes or less apart, or that increase in frequency, intensity, or length. Sudden, sharp pain in the abdomen or in the lower back. A gush or trickle of fluid from your vagina. You have signs of more serious illness, such as: Trouble breathing. Chest pain. A fever of 102.67F (39C) or higher that does not go away. Vomiting every time you drink fluids. Feeling extremely weak. Fainting. These symptoms may represent a serious problem that is an emergency. Do not wait to see if the symptoms will go away. Get medical help right away. Call your local emergency services (911 in the [place] Do not drive yourself to the hospital. Summary Pregnant women and women who were recently pregnant are at an increased risk for severe illness from COVID-19. Take precautions to protect yourself and your baby. Wear a mask. Wash hands often. Avoid touching your mouth, face, eyes, or nose before washing hands. Avoid large groups of people and stay away from people who are sick. If you think you have a COVID-19 infection, contact your health care provider right  away. Tell your health care provider that you think you have COVID-19 and that you are pregnant. If you have COVID-19, special precautions may be taken during pregnancy, labor and delivery, and after delivery. This information is not intended to replace advice given to you by your health care provider. Make sure you discuss any questions you have with your health care provider. Document Revised: 08/10/2020 Document Reviewed: 08/10/2020 Elsevier Patient Education  2021 [employer].

## 2020-11-18 LAB — HIV ANTIBODY (ROUTINE TESTING W REFLEX): HIV Screen 4th Generation wRfx: NONREACTIVE

## 2020-11-18 LAB — CBC
Hematocrit: 33.5 % — ABNORMAL LOW (ref 34.0–46.6)
Hemoglobin: 11.3 g/dL (ref 11.1–15.9)
MCH: 30.2 pg (ref 26.6–33.0)
MCHC: 33.7 g/dL (ref 31.5–35.7)
MCV: 90 fL (ref 79–97)
Platelets: 267 10*3/uL (ref 150–450)
RBC: 3.74 x10E6/uL — ABNORMAL LOW (ref 3.77–5.28)
RDW: 12.3 % (ref 11.7–15.4)
WBC: 8.1 10*3/uL (ref 3.4–10.8)

## 2020-11-18 LAB — GLUCOSE TOLERANCE, 2 HOURS W/ 1HR
Glucose, 1 hour: 123 mg/dL (ref 65–179)
Glucose, 2 hour: 61 mg/dL — ABNORMAL LOW (ref 65–152)
Glucose, Fasting: 78 mg/dL (ref 65–91)

## 2020-11-18 LAB — RPR: RPR Ser Ql: NONREACTIVE

## 2020-11-20 ENCOUNTER — Other Ambulatory Visit: Payer: Self-pay

## 2020-11-20 ENCOUNTER — Ambulatory Visit: Payer: Self-pay

## 2020-11-23 LAB — TOXASSURE SELECT 12(MW)

## 2020-12-01 ENCOUNTER — Encounter: Payer: Medicaid Other | Admitting: Obstetrics & Gynecology

## 2020-12-18 ENCOUNTER — Other Ambulatory Visit: Payer: Self-pay

## 2020-12-18 ENCOUNTER — Inpatient Hospital Stay (HOSPITAL_COMMUNITY)
Admission: AD | Admit: 2020-12-18 | Discharge: 2020-12-18 | Disposition: A | Discharge status: Home / Self Care | Payer: Medicaid Other | Attending: Obstetrics and Gynecology | Admitting: Obstetrics and Gynecology

## 2020-12-18 ENCOUNTER — Encounter (HOSPITAL_COMMUNITY): Payer: Self-pay | Admitting: Obstetrics and Gynecology

## 2020-12-18 DIAGNOSIS — Z3689 Encounter for other specified antenatal screening: Secondary | ICD-10-CM

## 2020-12-18 DIAGNOSIS — Z3A35 35 weeks gestation of pregnancy: Secondary | ICD-10-CM

## 2020-12-18 DIAGNOSIS — O4703 False labor before 37 completed weeks of gestation, third trimester: Secondary | ICD-10-CM

## 2020-12-18 DIAGNOSIS — O479 False labor, unspecified: Secondary | ICD-10-CM

## 2020-12-18 DIAGNOSIS — Z87891 Personal history of nicotine dependence: Secondary | ICD-10-CM | POA: Diagnosis not present

## 2020-12-18 DIAGNOSIS — O2341 Unspecified infection of urinary tract in pregnancy, first trimester: Secondary | ICD-10-CM

## 2020-12-18 LAB — URINALYSIS, ROUTINE W REFLEX MICROSCOPIC
Bacteria, UA: NONE SEEN
Bilirubin Urine: NEGATIVE
Glucose, UA: NEGATIVE mg/dL
Hgb urine dipstick: NEGATIVE
Ketones, ur: 20 mg/dL — AB
Nitrite: NEGATIVE
Protein, ur: NEGATIVE mg/dL
Specific Gravity, Urine: 1.008 (ref 1.005–1.030)
pH: 6 (ref 5.0–8.0)

## 2020-12-18 LAB — RAPID URINE DRUG SCREEN, HOSP PERFORMED
Amphetamines: NOT DETECTED
Barbiturates: NOT DETECTED
Benzodiazepines: POSITIVE — AB
Cocaine: POSITIVE — AB
Opiates: NOT DETECTED
Tetrahydrocannabinol: NOT DETECTED

## 2020-12-18 MED ORDER — CYCLOBENZAPRINE HCL 5 MG PO TABS
10.0000 mg | ORAL_TABLET | Freq: Once | ORAL | Status: AC
  Administered 2020-12-18: 10 mg via ORAL
  Filled 2020-12-18: qty 2

## 2020-12-18 MED ORDER — ONDANSETRON 4 MG PO TBDP
4.0000 mg | ORAL_TABLET | Freq: Once | ORAL | Status: AC
  Administered 2020-12-18: 4 mg via ORAL
  Filled 2020-12-18: qty 1

## 2020-12-18 MED ORDER — ACETAMINOPHEN 500 MG PO TABS
1000.0000 mg | ORAL_TABLET | Freq: Once | ORAL | Status: AC
  Administered 2020-12-18: 1000 mg via ORAL
  Filled 2020-12-18: qty 2

## 2020-12-18 NOTE — MAU Note (Signed)
Reports contractions x 2 hours, painful. Reports good fetal movement. Denies bleeding or ROM

## 2020-12-18 NOTE — Discharge Instructions (Signed)

## 2020-12-18 NOTE — MAU Provider Note (Signed)
History     CSN: [id number]  Arrival date and time: 12/18/20 0206   Event Date/Time   First Provider Initiated Contact with Patient 12/18/20 0234      Chief Complaint  Patient presents with  . Contractions   HPI Kristin Bates is a 20 y.o. G2P1001 at [redacted]w[redacted]d who presents to MAU with chief complaint of lower abdominal contractions. This is a new problem, onset about two hours prior to her arrival to MAU. Pain score is 6/10. Pain does not radiate. She describes the sensation as "uncomfortable" and "pressure". She has not taken medication or tried other treatments for this complaint. She denies vaginal bleeding, leaking of fluid, decreased fetal movement, fever, falls, or recent illness.   Patient receives care with [facility].  OB History    Gravida  2   Para  1   Term  1   Preterm      AB      Living  1     SAB      IAB      Ectopic      Multiple  0   Live Births  1           Past Medical History:  Diagnosis Date  . Drug overdose 05/27/2020   pt does not know what she tood  . Infection    UTI    Past Surgical History:  Procedure Laterality Date  . NO PAST SURGERIES      Family History  Problem Relation Age of Onset  . Healthy Mother   . Heart disease Father 73       Heart attack    Social History   Tobacco Use  . Smoking status: Former Smoker    Quit date: 09/09/2015    Years since quitting: 5.2  . Smokeless tobacco: Never Used  Vaping Use  . Vaping Use: Never used  Substance Use Topics  . Alcohol use: Not Currently    Comment: social   . Drug use: Yes    Types: Marijuana, IV, Benzodiazepines    Comment: says stopped with +preg, was smoking daily prior states was taking xanax every 3 days    Allergies: No Known Allergies  Medications Prior to Admission  Medication Sig Dispense Refill Last Dose  . fluticasone (FLONASE) 50 MCG/ACT nasal spray Place 2 sprays into both nostrils daily. (Patient not taking: No sig reported) 16 g 2   .  metroNIDAZOLE (FLAGYL) 500 MG tablet Take 1 tablet (500 mg total) by mouth 2 (two) times daily. (Patient not taking: No sig reported) 14 tablet 0   . naloxone (NARCAN) nasal spray 4 mg/0.1 mL For drug overdose. (Patient not taking: No sig reported) 1 each 0   . ondansetron (ZOFRAN ODT) 4 MG disintegrating tablet Take 1 tablet (4 mg total) by mouth every 6 (six) hours as needed for nausea. 30 tablet 2   . ondansetron (ZOFRAN) 8 MG tablet Take 1 tablet (8 mg total) by mouth daily as needed for nausea or vomiting. (Patient not taking: No sig reported) 30 tablet 1   . Prenatal Vit-Fe Fumarate-FA (PREPLUS) 27-1 MG TABS Take 1 tablet by mouth daily. (Patient not taking: Reported on 11/17/2020) 60 tablet 1     Review of Systems  Gastrointestinal: Positive for abdominal pain.  All other systems reviewed and are negative.  Physical Exam   Blood pressure 109/68, pulse 71, temperature 98 F (36.7 C), temperature source Oral, resp. rate 14, height 5\' 3"  (1.6 m),  weight 76.2 kg, last menstrual period 03/25/2020, SpO2 100 %.  Physical Exam Vitals and nursing note reviewed. Exam conducted with a chaperone present.  Constitutional:      Appearance: Normal appearance. She is not ill-appearing.  Cardiovascular:     Rate and Rhythm: Normal rate.     Pulses: Normal pulses.     Heart sounds: Normal heart sounds.  Pulmonary:     Effort: Pulmonary effort is normal.     Breath sounds: Normal breath sounds.  Abdominal:     Comments: Gravid  Skin:    Capillary Refill: Capillary refill takes less than 2 seconds.  Neurological:     Mental Status: She is alert and oriented to person, place, and time.  Psychiatric:        Mood and Affect: Mood normal.        Behavior: Behavior normal.        Thought Content: Thought content normal.        Judgment: Judgment normal.     MAU Course  Procedures  --Abnormal UA: negative bacteria, negative Nitrites --Cervix remains closed 2 hours s/p initial exam --Reactive  tracing: baseline 125, mod var, + 15 x 15 accels, no decels --Toco: irregular ctx q 4-10 min  Orders Placed This Encounter  Procedures  . Culture, OB Urine  . Urinalysis, Routine w reflex microscopic Urine, Clean Catch  . Rapid urine drug screen (hospital performed)  . Discharge patient   Patient Vitals for the past 24 hrs:  BP Temp Temp src Pulse Resp SpO2 Height Weight  12/18/20 0440 109/68 98.2 F (36.8 C) Oral 71 -- 100 % -- --  12/18/20 0232 110/76 98 F (36.7 C) Oral (!) 112 14 100 % -- --  12/18/20 0213 127/84 97.9 F (36.6 C) Oral (!) 103 15 100 % 5\' 3"  (1.6 m) 76.2 kg   Results for orders placed or performed during the hospital encounter of 12/18/20 (from the past 24 hour(s))  Rapid urine drug screen (hospital performed)     Status: Abnormal   Collection Time: 12/18/20  2:40 AM  Result Value Ref Range   Opiates NONE DETECTED NONE DETECTED   Cocaine POSITIVE (A) NONE DETECTED   Benzodiazepines POSITIVE (A) NONE DETECTED   Amphetamines NONE DETECTED NONE DETECTED   Tetrahydrocannabinol NONE DETECTED NONE DETECTED   Barbiturates NONE DETECTED NONE DETECTED  Urinalysis, Routine w reflex microscopic Urine, Clean Catch     Status: Abnormal   Collection Time: 12/18/20  3:50 AM  Result Value Ref Range   Color, Urine YELLOW YELLOW   APPearance CLEAR CLEAR   Specific Gravity, Urine 1.008 1.005 - 1.030   pH 6.0 5.0 - 8.0   Glucose, UA NEGATIVE NEGATIVE mg/dL   Hgb urine dipstick NEGATIVE NEGATIVE   Bilirubin Urine NEGATIVE NEGATIVE   Ketones, ur 20 (A) NEGATIVE mg/dL   Protein, ur NEGATIVE NEGATIVE mg/dL   Nitrite NEGATIVE NEGATIVE   Leukocytes,Ua SMALL (A) NEGATIVE   RBC / HPF 0-5 0 - 5 RBC/hpf   WBC, UA 11-20 0 - 5 WBC/hpf   Bacteria, UA NONE SEEN NONE SEEN   Squamous Epithelial / LPF 0-5 0 - 5   Assessment and Plan  --20 y.o. G2P1001 at [redacted]w[redacted]d  --Reactive tracing --Closed cervix --+ Cocaine, + Benzo, denies use --Discharge home in stable condition  F/U: --Next  appointment [facility] is 12/23/2020   12/25/2020, CNM 12/18/2020, 6:09 AM

## 2020-12-19 LAB — CULTURE, OB URINE

## 2020-12-23 ENCOUNTER — Encounter: Payer: Medicaid Other | Admitting: Obstetrics & Gynecology

## 2020-12-25 ENCOUNTER — Telehealth: Payer: Self-pay

## 2020-12-25 NOTE — Telephone Encounter (Signed)
Patient called and left message on triage vm. Attempted to reach patient. Message stating number has a vm that has not been set up yet.

## 2020-12-28 ENCOUNTER — Ambulatory Visit (INDEPENDENT_AMBULATORY_CARE_PROVIDER_SITE_OTHER): Payer: Medicaid Other | Admitting: Obstetrics & Gynecology

## 2020-12-28 ENCOUNTER — Other Ambulatory Visit: Payer: Self-pay

## 2020-12-28 ENCOUNTER — Other Ambulatory Visit (HOSPITAL_COMMUNITY)
Admission: RE | Admit: 2020-12-28 | Discharge: 2020-12-28 | Disposition: A | Discharge status: Home / Self Care | Payer: Medicaid Other | Source: Ambulatory Visit | Attending: Obstetrics & Gynecology | Admitting: Obstetrics & Gynecology

## 2020-12-28 ENCOUNTER — Encounter: Payer: Self-pay | Admitting: Obstetrics & Gynecology

## 2020-12-28 DIAGNOSIS — O99820 Streptococcus B carrier state complicating pregnancy: Secondary | ICD-10-CM

## 2020-12-28 DIAGNOSIS — O0993 Supervision of high risk pregnancy, unspecified, third trimester: Secondary | ICD-10-CM

## 2020-12-28 NOTE — Progress Notes (Signed)
   PRENATAL VISIT NOTE  Subjective:  Kristin Bates is a 20 y.o. G2P1001 at [redacted]w[redacted]d being seen today for ongoing prenatal care.  She is currently monitored for the following issues for this low-risk pregnancy and has Nausea and vomiting during pregnancy; Rubella non-immune status, antepartum; Urinary tract infection in pregnancy, antepartum, first trimester; History of drug overdose; Late prenatal care; Marijuana abuse; Supervision of high risk pregnancy, unspecified, third trimester; [redacted] weeks gestation of pregnancy; and Limited prenatal care, third trimester on their problem list.  Patient reports occasional contractions.  Contractions: Not present. Vag. Bleeding: None.  Movement: Present. Denies leaking of fluid.   The following portions of the patient's history were reviewed and updated as appropriate: allergies, current medications, past family history, past medical history, past social history, past surgical history and problem list.   Objective:   Vitals:   12/28/20 1404  BP: 120/86  Pulse: 87  Weight: 169 lb (76.7 kg)    Fetal Status: Fetal Heart Rate (bpm): 134   Movement: Present  Presentation: Vertex  General:  Alert, oriented and cooperative. Patient is in no acute distress.  Skin: Skin is warm and dry. No rash noted.   Cardiovascular: Normal heart rate noted  Respiratory: Normal respiratory effort, no problems with respiration noted  Abdomen: Soft, gravid, appropriate for gestational age.  Pain/Pressure: Absent     Pelvic: Cervical exam performed in the presence of a chaperone Dilation: 1 Effacement (%): 20 Station: Ballotable  Extremities: Normal range of motion.  Edema: None  Mental Status: Normal mood and affect. Normal behavior. Normal judgment and thought content.   Assessment and Plan:  Pregnancy: G2P1001 at [redacted]w[redacted]d 1. Supervision of high risk pregnancy, unspecified, third trimester Routine testing - Strep Gp B NAA - Cervicovaginal ancillary only( [facility])  Term  labor symptoms and general obstetric precautions including but not limited to vaginal bleeding, contractions, leaking of fluid and fetal movement were reviewed in detail with the patient. Please refer to After Visit Summary for other counseling recommendations.   Return in about 1 week (around 01/04/2021).  No future appointments.  Scheryl Darter, MD

## 2020-12-28 NOTE — Patient Instructions (Signed)

## 2020-12-28 NOTE — Progress Notes (Signed)
ROB [redacted]w[redacted]d  XV:[id number] about due date and would like cervix check

## 2020-12-29 LAB — CERVICOVAGINAL ANCILLARY ONLY
Chlamydia: NEGATIVE
Comment: NEGATIVE
Comment: NORMAL
Neisseria Gonorrhea: NEGATIVE

## 2020-12-30 LAB — STREP GP B NAA: Strep Gp B NAA: POSITIVE — AB

## 2020-12-31 DIAGNOSIS — O99820 Streptococcus B carrier state complicating pregnancy: Secondary | ICD-10-CM | POA: Insufficient documentation

## 2021-01-04 ENCOUNTER — Encounter: Payer: Medicaid Other | Admitting: Advanced Practice Midwife

## 2021-01-06 ENCOUNTER — Other Ambulatory Visit: Payer: Self-pay

## 2021-01-06 ENCOUNTER — Ambulatory Visit (INDEPENDENT_AMBULATORY_CARE_PROVIDER_SITE_OTHER): Payer: Medicaid Other | Admitting: Obstetrics & Gynecology

## 2021-01-06 VITALS — BP 115/78 | HR 93 | Wt 165.3 lb

## 2021-01-06 DIAGNOSIS — O0993 Supervision of high risk pregnancy, unspecified, third trimester: Secondary | ICD-10-CM

## 2021-01-06 DIAGNOSIS — O99820 Streptococcus B carrier state complicating pregnancy: Secondary | ICD-10-CM

## 2021-01-06 DIAGNOSIS — O2341 Unspecified infection of urinary tract in pregnancy, first trimester: Secondary | ICD-10-CM

## 2021-01-06 NOTE — Patient Instructions (Signed)

## 2021-01-06 NOTE — Progress Notes (Signed)
   PRENATAL VISIT NOTE  Subjective:  Kristin Bates is a 20 y.o. G2P1001 at [redacted]w[redacted]d being seen today for ongoing prenatal care.  She is currently monitored for the following issues for this high-risk pregnancy and has Nausea and vomiting during pregnancy; Rubella non-immune status, antepartum; Urinary tract infection in pregnancy, antepartum, first trimester; History of drug overdose; Late prenatal care; Marijuana abuse; Supervision of high risk pregnancy, unspecified, third trimester; [redacted] weeks gestation of pregnancy; Limited prenatal care, third trimester; and GBS (group B Streptococcus carrier), +RV culture, currently pregnant on their problem list.  Patient reports occasional contractions and pink discharge resolved.  Contractions: Irregular. Vag. Bleeding: Scant.  Movement: Present. Denies leaking of fluid.   The following portions of the patient's history were reviewed and updated as appropriate: allergies, current medications, past family history, past medical history, past social history, past surgical history and problem list.   Objective:   Vitals:   01/06/21 1439  BP: 115/78  Pulse: 93  Weight: 165 lb 4.8 oz (75 kg)    Fetal Status: Fetal Heart Rate (bpm): 140   Movement: Present  Presentation: Vertex  General:  Alert, oriented and cooperative. Patient is in no acute distress.  Skin: Skin is warm and dry. No rash noted.   Cardiovascular: Normal heart rate noted  Respiratory: Normal respiratory effort, no problems with respiration noted  Abdomen: Soft, gravid, appropriate for gestational age.  Pain/Pressure: Present     Pelvic: Cervical exam performed in the presence of a chaperone Dilation: 2.5 Effacement (%): 50 Station: -3  Extremities: Normal range of motion.  Edema: None  Mental Status: Normal mood and affect. Normal behavior. Normal judgment and thought content.   Assessment and Plan:  Pregnancy: G2P1001 at [redacted]w[redacted]d 1. Urinary tract infection in pregnancy, antepartum, first  trimester S/p treatment  2. Supervision of high risk pregnancy, unspecified, third trimester Labor precautions given  3. GBS (group B Streptococcus carrier), +RV culture, currently pregnant Treat in labor  Term labor symptoms and general obstetric precautions including but not limited to vaginal bleeding, contractions, leaking of fluid and fetal movement were reviewed in detail with the patient. Please refer to After Visit Summary for other counseling recommendations.   Return in about 1 week (around 01/13/2021).  No future appointments.  Scheryl Darter, MD

## 2021-01-06 NOTE — Progress Notes (Signed)
Pt reports fetal movement with irregular contractions. Pt reports that yesterday she was having some pink spotting and would like to get cervix checked today.

## 2021-01-07 ENCOUNTER — Emergency Department (HOSPITAL_COMMUNITY): Admission: EM | Admit: 2021-01-07 | Discharge: 2021-01-07 | Discharge status: Absent without leave | Payer: Self-pay

## 2021-01-07 ENCOUNTER — Encounter (HOSPITAL_COMMUNITY): Payer: Self-pay

## 2021-01-07 ENCOUNTER — Other Ambulatory Visit: Payer: Self-pay

## 2021-01-07 ENCOUNTER — Inpatient Hospital Stay (HOSPITAL_COMMUNITY)
Admission: EM | Admit: 2021-01-07 | Discharge: 2021-01-09 | DRG: 806 | Disposition: A | Discharge status: Home / Self Care | Payer: Medicaid Other | Attending: Obstetrics & Gynecology | Admitting: Obstetrics & Gynecology

## 2021-01-07 DIAGNOSIS — F1410 Cocaine abuse, uncomplicated: Secondary | ICD-10-CM | POA: Diagnosis present

## 2021-01-07 DIAGNOSIS — Z87891 Personal history of nicotine dependence: Secondary | ICD-10-CM

## 2021-01-07 DIAGNOSIS — Z20822 Contact with and (suspected) exposure to covid-19: Secondary | ICD-10-CM | POA: Diagnosis present

## 2021-01-07 DIAGNOSIS — Z23 Encounter for immunization: Secondary | ICD-10-CM | POA: Diagnosis not present

## 2021-01-07 DIAGNOSIS — O99324 Drug use complicating childbirth: Secondary | ICD-10-CM | POA: Diagnosis present

## 2021-01-07 DIAGNOSIS — O2341 Unspecified infection of urinary tract in pregnancy, first trimester: Secondary | ICD-10-CM

## 2021-01-07 DIAGNOSIS — O99824 Streptococcus B carrier state complicating childbirth: Secondary | ICD-10-CM | POA: Diagnosis present

## 2021-01-07 DIAGNOSIS — R109 Unspecified abdominal pain: Secondary | ICD-10-CM | POA: Diagnosis present

## 2021-01-07 DIAGNOSIS — Z3A38 38 weeks gestation of pregnancy: Secondary | ICD-10-CM

## 2021-01-07 DIAGNOSIS — Z3490 Encounter for supervision of normal pregnancy, unspecified, unspecified trimester: Secondary | ICD-10-CM

## 2021-01-07 LAB — RAPID URINE DRUG SCREEN, HOSP PERFORMED
Amphetamines: NOT DETECTED
Barbiturates: NOT DETECTED
Benzodiazepines: NOT DETECTED
Cocaine: NOT DETECTED
Opiates: NOT DETECTED
Tetrahydrocannabinol: POSITIVE — AB

## 2021-01-07 LAB — CBC
HCT: 32.7 % — ABNORMAL LOW (ref 36.0–46.0)
Hemoglobin: 10.5 g/dL — ABNORMAL LOW (ref 12.0–15.0)
MCH: 28.2 pg (ref 26.0–34.0)
MCHC: 32.1 g/dL (ref 30.0–36.0)
MCV: 87.7 fL (ref 80.0–100.0)
Platelets: 200 10*3/uL (ref 150–400)
RBC: 3.73 MIL/uL — ABNORMAL LOW (ref 3.87–5.11)
RDW: 14.6 % (ref 11.5–15.5)
WBC: 11.7 10*3/uL — ABNORMAL HIGH (ref 4.0–10.5)
nRBC: 0 % (ref 0.0–0.2)

## 2021-01-07 LAB — TYPE AND SCREEN
ABO/RH(D): O POS
Antibody Screen: NEGATIVE

## 2021-01-07 LAB — RESP PANEL BY RT-PCR (FLU A&B, COVID) ARPGX2
Influenza A by PCR: NEGATIVE
Influenza B by PCR: NEGATIVE
SARS Coronavirus 2 by RT PCR: NEGATIVE

## 2021-01-07 MED ORDER — FENTANYL CITRATE (PF) 100 MCG/2ML IJ SOLN
INTRAMUSCULAR | Status: AC
  Filled 2021-01-07: qty 2

## 2021-01-07 MED ORDER — TETANUS-DIPHTH-ACELL PERTUSSIS 5-2.5-18.5 LF-MCG/0.5 IM SUSY
0.5000 mL | PREFILLED_SYRINGE | Freq: Once | INTRAMUSCULAR | Status: AC
  Administered 2021-01-08: 0.5 mL via INTRAMUSCULAR
  Filled 2021-01-07: qty 0.5

## 2021-01-07 MED ORDER — ONDANSETRON HCL 4 MG/2ML IJ SOLN: 4.0000 mg | INTRAMUSCULAR | Status: DC | PRN

## 2021-01-07 MED ORDER — OXYTOCIN-SODIUM CHLORIDE 30-0.9 UT/500ML-% IV SOLN
INTRAVENOUS | Status: AC
  Filled 2021-01-07: qty 500

## 2021-01-07 MED ORDER — OXYCODONE-ACETAMINOPHEN 5-325 MG PO TABS
2.0000 | ORAL_TABLET | ORAL | Status: DC | PRN
Start: 2021-01-07 — End: 2021-01-07

## 2021-01-07 MED ORDER — PRENATAL MULTIVITAMIN CH
1.0000 | ORAL_TABLET | Freq: Every day | ORAL | Status: DC
  Administered 2021-01-07 – 2021-01-09 (×3): 1 via ORAL
  Filled 2021-01-07 (×3): qty 1

## 2021-01-07 MED ORDER — SOD CITRATE-CITRIC ACID 500-334 MG/5ML PO SOLN: 30.0000 mL | ORAL | Status: DC | PRN

## 2021-01-07 MED ORDER — ZOLPIDEM TARTRATE 5 MG PO TABS: 5.0000 mg | ORAL_TABLET | Freq: Every evening | ORAL | Status: DC | PRN

## 2021-01-07 MED ORDER — DIPHENHYDRAMINE HCL 25 MG PO CAPS
25.0000 mg | ORAL_CAPSULE | Freq: Four times a day (QID) | ORAL | Status: DC | PRN
Start: 2021-01-07 — End: 2021-01-09

## 2021-01-07 MED ORDER — LACTATED RINGERS IV SOLN: 500.0000 mL | INTRAVENOUS | Status: DC | PRN

## 2021-01-07 MED ORDER — BENZOCAINE-MENTHOL 20-0.5 % EX AERO
1.0000 "application " | INHALATION_SPRAY | CUTANEOUS | Status: DC | PRN
  Administered 2021-01-07: 1 via TOPICAL
  Filled 2021-01-07: qty 56

## 2021-01-07 MED ORDER — FLEET ENEMA 7-19 GM/118ML RE ENEM: 1.0000 | ENEMA | RECTAL | Status: DC | PRN

## 2021-01-07 MED ORDER — SENNOSIDES-DOCUSATE SODIUM 8.6-50 MG PO TABS
2.0000 | ORAL_TABLET | Freq: Every day | ORAL | Status: DC
  Administered 2021-01-08 – 2021-01-09 (×2): 2 via ORAL
  Filled 2021-01-07 (×2): qty 2

## 2021-01-07 MED ORDER — FENTANYL CITRATE (PF) 100 MCG/2ML IJ SOLN
100.0000 ug | Freq: Once | INTRAMUSCULAR | Status: AC
Start: 2021-01-07 — End: 2021-01-07
  Administered 2021-01-07: 100 ug via INTRAVENOUS

## 2021-01-07 MED ORDER — ACETAMINOPHEN 325 MG PO TABS: 650.0000 mg | ORAL_TABLET | ORAL | Status: DC | PRN

## 2021-01-07 MED ORDER — IBUPROFEN 600 MG PO TABS
600.0000 mg | ORAL_TABLET | Freq: Four times a day (QID) | ORAL | Status: DC
  Administered 2021-01-07 – 2021-01-09 (×10): 600 mg via ORAL
  Filled 2021-01-07 (×10): qty 1

## 2021-01-07 MED ORDER — DIBUCAINE (PERIANAL) 1 % EX OINT: 1.0000 "application " | TOPICAL_OINTMENT | CUTANEOUS | Status: DC | PRN

## 2021-01-07 MED ORDER — OXYCODONE-ACETAMINOPHEN 5-325 MG PO TABS
1.0000 | ORAL_TABLET | ORAL | Status: DC | PRN
Start: 2021-01-07 — End: 2021-01-07

## 2021-01-07 MED ORDER — SIMETHICONE 80 MG PO CHEW: 80.0000 mg | CHEWABLE_TABLET | ORAL | Status: DC | PRN

## 2021-01-07 MED ORDER — OXYTOCIN 10 UNIT/ML IJ SOLN
10.0000 [IU] | Freq: Once | INTRAMUSCULAR | Status: AC
  Administered 2021-01-07: 10 [IU] via INTRAMUSCULAR

## 2021-01-07 MED ORDER — OXYTOCIN-SODIUM CHLORIDE 30-0.9 UT/500ML-% IV SOLN: 2.5000 [IU]/h | INTRAVENOUS | Status: DC

## 2021-01-07 MED ORDER — ONDANSETRON HCL 4 MG/2ML IJ SOLN: 4.0000 mg | Freq: Four times a day (QID) | INTRAMUSCULAR | Status: DC | PRN

## 2021-01-07 MED ORDER — LIDOCAINE HCL (PF) 1 % IJ SOLN
INTRAMUSCULAR | Status: AC
  Filled 2021-01-07: qty 30

## 2021-01-07 MED ORDER — MEASLES, MUMPS & RUBELLA VAC IJ SOLR
0.5000 mL | Freq: Once | INTRAMUSCULAR | Status: AC
  Administered 2021-01-08: 0.5 mL via SUBCUTANEOUS
  Filled 2021-01-07: qty 0.5

## 2021-01-07 MED ORDER — LACTATED RINGERS IV SOLN: INTRAVENOUS | Status: DC

## 2021-01-07 MED ORDER — WITCH HAZEL-GLYCERIN EX PADS: 1.0000 "application " | MEDICATED_PAD | CUTANEOUS | Status: DC | PRN

## 2021-01-07 MED ORDER — ONDANSETRON HCL 4 MG PO TABS: 4.0000 mg | ORAL_TABLET | ORAL | Status: DC | PRN

## 2021-01-07 MED ORDER — LIDOCAINE HCL (PF) 1 % IJ SOLN
30.0000 mL | INTRAMUSCULAR | Status: AC | PRN
  Administered 2021-01-07: 30 mL via SUBCUTANEOUS

## 2021-01-07 MED ORDER — COCONUT OIL OIL: 1.0000 "application " | TOPICAL_OIL | Status: DC | PRN

## 2021-01-07 MED ORDER — OXYTOCIN BOLUS FROM INFUSION
333.0000 mL | Freq: Once | INTRAVENOUS | Status: AC
  Administered 2021-01-07: 333 mL via INTRAVENOUS

## 2021-01-07 NOTE — Progress Notes (Signed)
RROB called to [place] ED for  G2 P1 at 38w 5d complaining of UCs.  Upon arrival, RROB adjusts fhr monitors.  Pt reports no complications with this pregnancy and a hx of a non complicated vaginal delivery with previous pregnancy.  Pt is writhing around in the bed.  SVE performed. Cervix was 9/100/0 with bulging bag.  Dr Charlotta Newton called and says she will head to [facility].  At 0923, pt SROMs  With a moderate amt of meconium stained fluid.  Viable female infant is born at 33.  There was a loose nuchal cord that infant was delivered through.  Infants apgars were 9/9 and baby placed skin to skin with mother.  Dr Charlotta Newton arrived at [phone] and delivered placenta.  Once mom and baby were stabilized, they were transported separately to Labor and Delivery.

## 2021-01-07 NOTE — H&P (Signed)
Kristin Bates is a 20 y.o. female G2P1001 with IUP at [redacted]w[redacted]d by LMP presenting for precipitous delivery in [facility] She reports positive fetal movement. She denies leakage of fluid or vaginal bleeding.  Prenatal History/Complications: PNC at [facility] Pregnancy complications:  - Past Medical History: Past Medical History:  Diagnosis Date  . Drug overdose 05/27/2020   pt does not know what she tood  . Infection    UTI    Past Surgical History: Past Surgical History:  Procedure Laterality Date  . NO PAST SURGERIES      Obstetrical History: OB History    Gravida  2   Para  1   Term  1   Preterm      AB      Living  1     SAB      IAB      Ectopic      Multiple  0   Live Births  1            Social History: Social History   Socioeconomic History  . Marital status: Single    Spouse name: Not on file  . Number of children: Not on file  . Years of education: Not on file  . Highest education level: Not on file  Occupational History  . Not on file  Tobacco Use  . Smoking status: Former Smoker    Quit date: 09/09/2015    Years since quitting: 5.3  . Smokeless tobacco: Never Used  Vaping Use  . Vaping Use: Never used  Substance and Sexual Activity  . Alcohol use: Not Currently    Comment: social   . Drug use: Yes    Types: Marijuana, IV, Benzodiazepines    Comment: says stopped with +preg, was smoking daily prior states was taking xanax every 3 days  . Sexual activity: Not Currently    Birth control/protection: None  Other Topics Concern  . Not on file  Social History Narrative  . Not on file   Social Determinants of Health   Financial Resource Strain: Not on file  Food Insecurity: Not on file  Transportation Needs: Not on file  Physical Activity: Not on file  Stress: Not on file  Social Connections: Not on file    Family History: Family History  Problem Relation Age of Onset  . Healthy Mother   . Heart disease Father 36       Heart  attack    Allergies: No Known Allergies  Medications Prior to Admission  Medication Sig Dispense Refill Last Dose  . ondansetron (ZOFRAN ODT) 4 MG disintegrating tablet Take 1 tablet (4 mg total) by mouth every 6 (six) hours as needed for nausea. (Patient not taking: No sig reported) 30 tablet 2   . Prenatal Vit-Fe Fumarate-FA (PREPLUS) 27-1 MG TABS Take 1 tablet by mouth daily. (Patient not taking: No sig reported) 60 tablet 1     Review of Systems   Constitutional: Negative for fever and chills Eyes: Negative for visual disturbances Respiratory: Negative for shortness of breath, dyspnea Cardiovascular: Negative for chest pain or palpitations  Gastrointestinal: Negative for vomiting, diarrhea and constipation.  POSITIVE for abdominal pain (contractions) Genitourinary: Negative for dysuria and urgency Musculoskeletal: Negative for back pain, joint pain, myalgias  Neurological: Negative for dizziness and headaches  Blood pressure (!) 128/98, pulse 76, temperature 98.1 F (36.7 C), temperature source Oral, resp. rate 20, height 5\' 3"  (1.6 m), weight 74.9 kg, last menstrual period 03/25/2020, SpO2 100 %. General  appearance: alert and cooperative Lungs: normal respiratory effort Heart: regular rate and rhythm Abdomen: soft, non-tender; bowel sounds normal Extremities: Homans sign is negative, no sign of DVT DTR's 2+ Presentation: NA Fetal monitoring  NA Uterine activity  NA Dilation: (S) 9 (as per RN Erin at bedside. )   Prenatal labs: ABO, Rh: O/Positive/-- (12/09 0949) Antibody: Negative (12/09 0949) Rubella: <0.90 (12/09 0949) RPR: Non Reactive (02/08 1047)  HBsAg: Negative (12/09 0949)  HIV: Non Reactive (02/08 1047)  GBS: Positive/-- (03/21 0259)  1 hr Glucola passed Genetic screening  declined Anatomy US not done  Prenatal Transfer Tool  Maternal Diabetes: No Genetic Screening: Declined Maternal Ultrasounds/Referrals: Normal Fetal Ultrasounds or other Referrals:   None Maternal Substance Abuse:  Yes:  Type: Cocaine, Other: had heroin overdose in 1st trimester Significant Maternal Medications:  None Significant Maternal Lab Results: Group B Strep positive  No results found for this or any previous visit (from the past 24 hour(s)).  Assessment: Kristin Bates is a 20 y.o. G2P1001 with an IUP at [redacted]w[redacted]d presenting for s/p NSVD in [facility] with Davina Poke RN and Dr. Charlotta Newton delivering placenta.   Assessed patient in L and D; pitocin running through IV and patient is s/p 10 mu of pitocin IM. Otherwise hemostatic with mild/moderate lochia, no clots.   1st degree perineal laceration noted and repaired, as well as bilateral periurethral lacerations. Right periurethral was gaping so repaired as well. Left periurethral not repaired as was hemostatic.   Plan: #Labor: expectant management #Pain:  Per request #FWB Cat 1 #ID: GBS: pos-untreated #MOF:  bottle #MOC: undecided #Circ: yes   Marylene Land 01/07/2021, 11:37 AM

## 2021-01-07 NOTE — ED Notes (Addendum)
Baby delivered at 924 with Dr. Charlotta Newton, EDP, EDPA, OB rapid response nurse present. As per MD Ozan IV not indicated at this time.

## 2021-01-07 NOTE — ED Notes (Addendum)
OB rapid response Engineer, mining at bedside.

## 2021-01-07 NOTE — ED Provider Notes (Signed)
  Face-to-face evaluation   History: Patient presented in active labor, with contractions, and bleeding from the vagina.  She is [redacted] weeks pregnant.  She was evaluated yesterday and was dilated to 3 cm.  She reports her pregnancy has been uncomplicated so far.  Physical exam: Alert, uncomfortable.  Abdomen is gravid, uterine fundus is nontender to palpation.  Palpable uterine contractions.  Continue attendance with the patient, while laboring and awaiting for OB.  Patient had precipitous delivery, which I assisted with, along with the OB rapid response nurse.  Patient delivered with bleeding and stool, present.  Child cried immediately, and I suction nose and mouth.  I assisted with clamping the cord, and cutting the cord.  Initial Apgars are reassuring.  Obstetrician arrived about 2 minutes after birth.  Medical screening examination/treatment/procedure(s) were conducted as a shared visit with non-physician practitioner(s) and myself.  I personally evaluated the patient during the encounter    Mancel Bale, MD 01/07/21 1547

## 2021-01-07 NOTE — Discharge Summary (Addendum)
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 yo G2P1001 at 361w5das assessed in Active Labor to [facility] and found to be 9 cm dilated with bag of waters intact. Patient delivered spontaneously with RN Rapid Response and Dr. WeEulis Fosterssisting. Dr. OzNelda Marseillerrived and delivered placenta. [facility]. Lacerations repaired by Adriane Guglielmo CNM. n 01/07/2021. Patient had an uncomplicated labor course as follows:  Membrane Rupture Time/Date: 9:23 AM ,01/07/2021   Delivery Method:Vaginal, Spontaneous  Episiotomy: None  Lacerations:  Periurethral  Patient had an uncomplicated postpartum course.  She is ambulating, tolerating a regular diet, passing flatus, and urinating well. Patient is discharged home in stable condition on 01/09/21.  Newborn Data: Birth date:01/07/2021  Birth time:9:24 AM  Gender:Female  Living status:Living  Apgars:9 ,9  Weight:3666 g   Magnesium Sulfate received: No BMZ received: No Rhophylac:No MMR: done T-DaP:declined Flu: Yes Transfusion:No  Physical exam  Vitals:   01/08/21 0527 01/08/21 1541 01/08/21 2033 01/09/21 0540  BP: 123/84 109/64 100/72 103/78   Pulse: 63 86 85 83  Resp: 18 17 18 16   Temp: 98 F (36.7 C) 98.2 F (36.8 C) 97.8 F (36.6 C) 97.7 F (36.5 C)  TempSrc: Oral Oral Oral Oral  SpO2:   100% 100%  Weight:      Height:       General: alert, cooperative and no distress Lochia: appropriate Uterine Fundus: firm Incision: N/A DVT Evaluation: No evidence of DVT seen on physical exam. Labs: Lab Results  Component Value Date   WBC 6.7 01/08/2021   HGB 8.9 (L) 01/08/2021   HCT 28.0 (L) 01/08/2021   MCV 87.0 01/08/2021   PLT 171 01/08/2021   CMP Latest Ref Rng & Units 05/28/2020  Glucose 70 - 99 mg/dL 99  BUN 6 - 20 mg/dL 7  Creatinine 0.44 - 1.00 mg/dL 0.62  Sodium 135 - 145 mmol/L 134(L)  Potassium 3.5 - 5.1 mmol/L 3.9  Chloride 98 - 111 mmol/L 104  CO2 22 - 32 mmol/L 23  Calcium 8.9 - 10.3 mg/dL 8.1(L)  Total Protein 6.5 - 8.1 g/dL 5.6(L)  Total Bilirubin 0.3 - 1.2 mg/dL 0.5  Alkaline Phos 38 - 126 U/L 44  AST 15 - 41 U/L 94(H)  ALT 0 - 44 U/L 86(H)   EdFlavia Shippercore: Edinburgh Postnatal Depression Scale Screening Tool 01/09/2021  I have been able to laugh and see the funny side of things. 0  I have looked forward with enjoyment to things. 0  I have blamed myself unnecessarily when  things went wrong. 0  I have been anxious or worried for no good reason. 0  I have felt scared or panicky for no good reason. 0  Things have been getting on top of me. 0  I have been so unhappy that I have had difficulty sleeping. 0  I have felt sad or miserable. 0  I have been so unhappy that I have been crying. 0  The thought of harming myself has occurred to me. 0  Edinburgh Postnatal Depression Scale Total 0     After visit meds:  Allergies as of 01/09/2021   No Known Allergies     Medication List    STOP taking these medications   calcium carbonate 500 MG chewable tablet Commonly known as: TUMS - dosed in mg elemental calcium   ondansetron 4 MG disintegrating tablet Commonly known as: Zofran ODT   PrePLUS  27-1 MG Tabs        Discharge home in stable condition Infant Feeding: No evidence of DVT seen on physical exam. Infant Disposition:home with mother Discharge instruction: per After Visit Summary and Postpartum booklet. Activity: Advance as tolerated. Pelvic rest for 6 weeks.  Diet: routine diet Future Appointments: Future Appointments  Date Time Provider [place]  02/04/2021  2:00 PM Gavin Pound, CNM [facility] None   Follow up Visit:   Please schedule this patient for a In person postpartum visit in 4 weeks with the following provider: Any provider. Additional Postpartum F/U:None  Low risk pregnancy complicated by: precipitous delivery and substance abuse Delivery mode:  Vaginal, Spontaneous  Anticipated Birth Control:  Unsure, wants to talk at her [place] visit   01/09/2021 Starr Lake, CNM

## 2021-01-07 NOTE — ED Provider Notes (Addendum)
[facility]-EMERGENCY DEPT Provider Note   CSN: [id number] Arrival date & time: 01/07/21  5573     History Chief Complaint  Patient presents with  . Labor Eval    Kristin Bates is a 20 y.o. female G2P1 approximately [redacted]w[redacted]d presents for abdominal pain.  Seen yesterday by Dr. Debroah Loop with OB at [facility] at [facility]. Lower abd cramping, feels like she needs to push. Bloody show presents  Rapid OB RN at bedside  Level 5 caveat- Acuity of condition  HPI     Past Medical History:  Diagnosis Date  . Drug overdose 05/27/2020   pt does not know what she tood  . Infection    UTI    Patient Active Problem List   Diagnosis Date Noted  . GBS (group B Streptococcus carrier), +RV culture, currently pregnant 12/31/2020  . [redacted] weeks gestation of pregnancy 11/17/2020  . Limited prenatal care, third trimester 11/17/2020  . Late prenatal care 09/17/2020  . Marijuana abuse 09/17/2020  . Supervision of high risk pregnancy, unspecified, third trimester 09/17/2020  . History of drug overdose 05/28/2020  . Urinary tract infection in pregnancy, antepartum, first trimester 05/27/2020  . Rubella non-immune status, antepartum 12/22/2015  . Nausea and vomiting during pregnancy 11/24/2015    Past Surgical History:  Procedure Laterality Date  . NO PAST SURGERIES       OB History    Gravida  2   Para  1   Term  1   Preterm      AB      Living  1     SAB      IAB      Ectopic      Multiple  0   Live Births  1           Family History  Problem Relation Age of Onset  . Healthy Mother   . Heart disease Father 49       Heart attack    Social History   Tobacco Use  . Smoking status: Former Smoker    Quit date: 09/09/2015    Years since quitting: 5.3  . Smokeless tobacco: Never Used  Vaping Use  . Vaping Use: Never used  Substance Use Topics  . Alcohol use: Not Currently    Comment: social   . Drug use: Yes    Types: Marijuana, IV,  Benzodiazepines    Comment: says stopped with +preg, was smoking daily prior states was taking xanax every 3 days    Home Medications Prior to Admission medications   Medication Sig Start Date End Date Taking? Authorizing Provider  ondansetron (ZOFRAN ODT) 4 MG disintegrating tablet Take 1 tablet (4 mg total) by mouth every 6 (six) hours as needed for nausea. Patient not taking: No sig reported 11/17/20   Warden Fillers, MD  Prenatal Vit-Fe Fumarate-FA (PREPLUS) 27-1 MG TABS Take 1 tablet by mouth daily. Patient not taking: No sig reported 09/17/20   [place] Bing, MD    Allergies    Patient has no known allergies.  Review of Systems   Review of Systems  Unable to perform ROS: Acuity of condition    Physical Exam Updated Vital Signs BP 112/83   Pulse 71   Temp 98.7 F (37.1 C) (Oral)   Resp 18   Ht 5\' 3"  (1.6 m)   LMP 03/25/2020   SpO2 100%   BMI 29.28 kg/m   Physical Exam Vitals and nursing note reviewed. Exam conducted  with a chaperone present.  Constitutional:      General: She is in acute distress.     Appearance: She is well-developed.     Comments: Patient in active labor  HENT:     Head: Atraumatic.  Eyes:     Pupils: Pupils are equal, round, and reactive to light.  Cardiovascular:     Rate and Rhythm: Normal rate.  Pulmonary:     Effort: No respiratory distress.  Abdominal:     General: There is no distension.     Comments: Gravid abdomen. Fundus nontender  Genitourinary:    Comments: 9 cm 100% effaced Musculoskeletal:        General: Normal range of motion.     Cervical back: Normal range of motion.  Skin:    General: Skin is warm and dry.  Neurological:     Mental Status: She is alert.    ED Results / Procedures / Treatments   Labs (all labs ordered are listed, but only abnormal results are displayed) Labs Reviewed - No data to display  EKG None  Radiology No results found.  Procedures OB Delivery  Date/Time: 01/07/2021 9:24  AM Performed by: Linwood Dibbles, PA-C Authorized by: Linwood Dibbles, PA-C   Consent Done?:  Emergent Situation Risks, benefits and alternatives were discussed comment:  Emergent delivery Delivery Summary For::  Mother Procedure Details - Mother:    Amniotic sac:  Intact membrane   Amniotic fluid:  Light meconium   Shoulder dystocia:  None   Episiotomy:  None   Cord Complication::  None   Cord Around::  Shoulders   Cord blood sent:: No     Child Living status::  Yes   Placenta Delivered::  Yes   Placenta delivered  x minutes after fetal delivery::  5   Placenta Removal::  Manual Removal   Placenta Appearance::  Intact   Vaginal Packing::  None  Placenta delivered by Dr. Alfonzo Beers  .Critical Care Performed by: Linwood Dibbles, PA-C Authorized by: Linwood Dibbles, PA-C   Critical care provider statement:    Critical care time (minutes):  45   Critical care was necessary to treat or prevent imminent or life-threatening deterioration of the following conditions: Delivery.   Critical care was time spent personally by me on the following activities:  Discussions with consultants, evaluation of patient's response to treatment, examination of patient, ordering and performing treatments and interventions, ordering and review of laboratory studies, ordering and review of radiographic studies, pulse oximetry, re-evaluation of patient's condition, obtaining history from patient or surrogate and review of old charts     Medications Ordered in ED Medications - No data to display  ED Course  I have reviewed the triage vital signs and the nursing notes.  Pertinent labs & imaging results that were available during my care of the patient were reviewed by me and considered in my medical decision making (see chart for details).  [redacted]w[redacted]d presents in active labor. Contractions on FM q 1-2 minues. 9 cm dilated and 100% effaced with Rapid OB nurse in room. Patient with bloody show. Dr.  Charlotta Newton with OB called on patient arrival. Attending Dr. Effie Shy in room to assist with Rapid OB nurse and myself.  Patient successful delivered vigorous female newborn with light meconium stained fluids at 9:24 AM. Water broke on delivery. See. Attending note.  Dr. Alfonzo Beers arrived 2 minutes after infant delivery and delivered placenta.  Infant Apgars 1 min 7>>5 min 9. Cord  clamped>> infant to moms chest. Good cry  Patient with firm fundus post delivery. Small perineal tear assessed by OB. OB will follow up on lac after transfer to Labor and delivery  Patient to be transferred to MAU with [facility]. Stable VS. No current evidence of Post partum hemorrhage currently.  The patient appears reasonably stabilized for admission considering the current resources, flow, and capabilities available in the ED at this time, and I doubt any other [facility] requiring further screening and/or treatment in the ED prior to admission.     MDM Rules/Calculators/A&P                           Final Clinical Impression(s) / ED Diagnoses Final diagnoses:  NSVD (normal spontaneous vaginal delivery)    Rx / DC Orders ED Discharge Orders    None         Miklos Bidinger A, PA-C 01/07/21 1012    Reet Scharrer A, PA-C 01/07/21 1041    Lamanda Rudder A, PA-C 01/07/21 1239    Mancel Bale, MD 01/07/21 1547

## 2021-01-07 NOTE — ED Triage Notes (Signed)
Pt walked in, saw OB yesterday, having contractions. [redacted] weeks pregnant. OB rapid response called on arrival. G2P1

## 2021-01-08 LAB — CBC
HCT: 28 % — ABNORMAL LOW (ref 36.0–46.0)
Hemoglobin: 8.9 g/dL — ABNORMAL LOW (ref 12.0–15.0)
MCH: 27.6 pg (ref 26.0–34.0)
MCHC: 31.8 g/dL (ref 30.0–36.0)
MCV: 87 fL (ref 80.0–100.0)
Platelets: 171 10*3/uL (ref 150–400)
RBC: 3.22 MIL/uL — ABNORMAL LOW (ref 3.87–5.11)
RDW: 14.5 % (ref 11.5–15.5)
WBC: 6.7 10*3/uL (ref 4.0–10.5)
nRBC: 0 % (ref 0.0–0.2)

## 2021-01-08 LAB — RPR: RPR Ser Ql: NONREACTIVE

## 2021-01-08 NOTE — Social Work (Addendum)
CSW spoke with [facility] CPS and confirmed report was accepted. CPS will complete assessment with MOB within 24 hours.  Case assigned to CPS SW Sandrea Hughs [phone]. CSW unsuccessfully attempted to contact CPS SW [facility] for follow-up.    Barriers to discharge, charge RN updated.   Manfred Arch, MSW, [employer] Clinical Social Work [employer] and [employer] [phone]
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
## 2021-01-08 NOTE — Progress Notes (Signed)
POSTPARTUM PROGRESS NOTE  Subjective: Kristin Bates is a 20 y.o. E0P2330 s/p SVD at [redacted]w[redacted]d  She reports she doing well. No acute events overnight. She denies any problems with ambulating, voiding or po intake. Denies nausea or vomiting. She has passed flatus. Pain is well controlled.  Lochia is mild.  Objective: Blood pressure 123/84, pulse 63, temperature 98 F (36.7 C), temperature source Oral, resp. rate 18, height 5' 3"  (1.6 m), weight 74.9 kg, last menstrual period 03/25/2020, SpO2 100 %, unknown if currently breastfeeding.  Physical Exam:  General: alert, cooperative and no distress Chest: no respiratory distress Abdomen: soft, appropriately tender Uterine Fundus: firm and at level of umbilicus Extremities: No calf swelling or tenderness  no edema  Recent Labs    01/07/21 1102 01/08/21 0532  HGB 10.5* 8.9*  HCT 32.7* 28.0*    Assessment/Plan: Kristin Spinneyis a 20y.o. [id number]/p precipitous [facility]    Routine Postpartum Care: Doing well, pain well-controlled.  -- Continue routine care, lactation support  -- Contraception: desires IUD as outpatient  -- Feeding: formula   -History of substance use, overdose in first trimester: SW consult pending.  -Rubella nonimmune: MMR orddered -Consented for circumcision at bedside    Dispo: Plan for discharge PPD#2.  Kristin BerlinMD OB Fellow, Faculty Practice 01/08/2021 7:10 AM

## 2021-01-09 NOTE — Progress Notes (Signed)
CSW escorted [facility] CPS worker Rose C. to MOB's bedside. Per CPS, there are no barriers to infant's discharge to [facility] when infant is medically ready. CPS will continue to provide resources and support to family post discharge (CPS worker will be Bradly Bienenstock). CSW will continue to monitor infant's CDS and will provide CPS with the results.   Blaine Hamper, MSW, LCSW Clinical Social Work [phone]

## 2021-01-11 LAB — SURGICAL PATHOLOGY

## 2021-01-13 ENCOUNTER — Encounter: Payer: Medicaid Other | Admitting: Obstetrics and Gynecology

## 2021-02-04 ENCOUNTER — Ambulatory Visit: Payer: Medicaid Other

## 2021-06-13 ENCOUNTER — Emergency Department (HOSPITAL_COMMUNITY)
Admission: EM | Admit: 2021-06-13 | Discharge: 2021-06-14 | Disposition: A | Discharge status: Home / Self Care | Payer: Medicaid Other | Attending: Emergency Medicine | Admitting: Emergency Medicine

## 2021-06-13 DIAGNOSIS — Z87891 Personal history of nicotine dependence: Secondary | ICD-10-CM | POA: Insufficient documentation

## 2021-06-13 DIAGNOSIS — R000 Tachycardia, unspecified: Secondary | ICD-10-CM | POA: Diagnosis not present

## 2021-06-13 DIAGNOSIS — T401X1A Poisoning by heroin, accidental (unintentional), initial encounter: Secondary | ICD-10-CM | POA: Insufficient documentation

## 2021-06-13 DIAGNOSIS — Y907 Blood alcohol level of 200-239 mg/100 ml: Secondary | ICD-10-CM | POA: Insufficient documentation

## 2021-06-13 DIAGNOSIS — X58XXXA Exposure to other specified factors, initial encounter: Secondary | ICD-10-CM | POA: Insufficient documentation

## 2021-06-13 MED ORDER — ONDANSETRON HCL 4 MG/2ML IJ SOLN
INTRAMUSCULAR | Status: AC
  Filled 2021-06-13: qty 2

## 2021-06-13 NOTE — ED Triage Notes (Signed)
Pt BIB POV unresponsive and apneic. Per friends pt used heroin and ETOH tonight. Given 2mg  IV narcan with +response. Pt vomited, given 4mg  IV zofran.

## 2021-06-14 ENCOUNTER — Other Ambulatory Visit: Payer: Self-pay

## 2021-06-14 LAB — ETHANOL: Alcohol, Ethyl (B): 220 mg/dL — ABNORMAL HIGH (ref <10)

## 2021-06-14 LAB — I-STAT BETA HCG BLOOD, ED (MC, WL, AP ONLY): I-stat hCG, quantitative: <5 m[IU]/mL (ref <5)

## 2021-06-14 LAB — CBC WITH DIFFERENTIAL/PLATELET
Abs Immature Granulocytes: 0.01 10*3/uL (ref 0.00–0.07)
Basophils Absolute: 0 10*3/uL (ref 0.0–0.1)
Basophils Relative: 1 %
Eosinophils Absolute: 0 10*3/uL (ref 0.0–0.5)
Eosinophils Relative: 1 %
HCT: 37.5 % (ref 36.0–46.0)
Hemoglobin: 11.3 g/dL — ABNORMAL LOW (ref 12.0–15.0)
Immature Granulocytes: 0 %
Lymphocytes Relative: 47 %
Lymphs Abs: 2.3 10*3/uL (ref 0.7–4.0)
MCH: 27.3 pg (ref 26.0–34.0)
MCHC: 30.1 g/dL (ref 30.0–36.0)
MCV: 90.6 fL (ref 80.0–100.0)
Monocytes Absolute: 0.3 10*3/uL (ref 0.1–1.0)
Monocytes Relative: 6 %
Neutro Abs: 2.2 10*3/uL (ref 1.7–7.7)
Neutrophils Relative %: 45 %
Platelets: 225 10*3/uL (ref 150–400)
RBC: 4.14 MIL/uL (ref 3.87–5.11)
RDW: 15.2 % (ref 11.5–15.5)
WBC: 4.9 10*3/uL (ref 4.0–10.5)
nRBC: 0 % (ref 0.0–0.2)

## 2021-06-14 LAB — COMPREHENSIVE METABOLIC PANEL
ALT: 18 U/L (ref 0–44)
AST: 30 U/L (ref 15–41)
Albumin: 3.7 g/dL (ref 3.5–5.0)
Alkaline Phosphatase: 57 U/L (ref 38–126)
Anion gap: 13 (ref 5–15)
BUN: 6 mg/dL (ref 6–20)
CO2: 19 mmol/L — ABNORMAL LOW (ref 22–32)
Calcium: 8.5 mg/dL — ABNORMAL LOW (ref 8.9–10.3)
Chloride: 104 mmol/L (ref 98–111)
Creatinine, Ser: 1.08 mg/dL — ABNORMAL HIGH (ref 0.44–1.00)
GFR, Estimated: >60 mL/min (ref >60)
Glucose, Bld: 178 mg/dL — ABNORMAL HIGH (ref 70–99)
Potassium: 2.9 mmol/L — ABNORMAL LOW (ref 3.5–5.1)
Sodium: 136 mmol/L (ref 135–145)
Total Bilirubin: 0.1 mg/dL — ABNORMAL LOW (ref 0.3–1.2)
Total Protein: 6.7 g/dL (ref 6.5–8.1)

## 2021-06-14 LAB — CBG MONITORING, ED: Glucose-Capillary: 177 mg/dL — ABNORMAL HIGH (ref 70–99)

## 2021-06-14 MED ORDER — LACTATED RINGERS IV BOLUS
1000.0000 mL | Freq: Once | INTRAVENOUS | Status: AC
  Administered 2021-06-14: 1000 mL via INTRAVENOUS
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
## 2021-06-14 NOTE — ED Notes (Signed)
RN aware of pt o2 levels

## 2021-06-14 NOTE — ED Notes (Signed)
Pt heard yelling at the visitor in the room. Pt now alert, removed ekg leads, bp cuff, spO2 monitor and IV. EDP made aware.

## 2021-06-14 NOTE — ED Notes (Signed)
RN aware of dropped BP

## 2021-06-14 NOTE — Progress Notes (Signed)
Pt came into ED unresponsive and was requiring BVM. Narcan was given and pt was less obtunded. RT will  monitor as needed.

## 2021-06-21 ENCOUNTER — Other Ambulatory Visit: Payer: Self-pay

## 2021-07-16 ENCOUNTER — Other Ambulatory Visit: Payer: Self-pay

## 2021-07-16 ENCOUNTER — Emergency Department (HOSPITAL_COMMUNITY): Payer: Medicaid Other

## 2021-07-16 ENCOUNTER — Encounter (HOSPITAL_COMMUNITY): Payer: Self-pay

## 2021-07-16 ENCOUNTER — Emergency Department (HOSPITAL_COMMUNITY)
Admission: EM | Admit: 2021-07-16 | Discharge: 2021-07-16 | Disposition: A | Discharge status: Home / Self Care | Payer: Medicaid Other | Attending: Emergency Medicine | Admitting: Emergency Medicine

## 2021-07-16 DIAGNOSIS — S80811A Abrasion, right lower leg, initial encounter: Secondary | ICD-10-CM | POA: Insufficient documentation

## 2021-07-16 DIAGNOSIS — S8002XA Contusion of left knee, initial encounter: Secondary | ICD-10-CM | POA: Diagnosis not present

## 2021-07-16 DIAGNOSIS — S0081XA Abrasion of other part of head, initial encounter: Secondary | ICD-10-CM | POA: Diagnosis not present

## 2021-07-16 DIAGNOSIS — S9002XA Contusion of left ankle, initial encounter: Secondary | ICD-10-CM | POA: Insufficient documentation

## 2021-07-16 DIAGNOSIS — Y92410 Unspecified street and highway as the place of occurrence of the external cause: Secondary | ICD-10-CM | POA: Insufficient documentation

## 2021-07-16 DIAGNOSIS — M791 Myalgia, unspecified site: Secondary | ICD-10-CM | POA: Insufficient documentation

## 2021-07-16 DIAGNOSIS — S00531A Contusion of lip, initial encounter: Secondary | ICD-10-CM | POA: Diagnosis not present

## 2021-07-16 DIAGNOSIS — R519 Headache, unspecified: Secondary | ICD-10-CM | POA: Diagnosis not present

## 2021-07-16 DIAGNOSIS — F17210 Nicotine dependence, cigarettes, uncomplicated: Secondary | ICD-10-CM | POA: Insufficient documentation

## 2021-07-16 DIAGNOSIS — S99912A Unspecified injury of left ankle, initial encounter: Secondary | ICD-10-CM | POA: Diagnosis present

## 2021-07-16 DIAGNOSIS — M549 Dorsalgia, unspecified: Secondary | ICD-10-CM | POA: Insufficient documentation

## 2021-07-16 LAB — COMPREHENSIVE METABOLIC PANEL
ALT: 15 U/L (ref 0–44)
AST: 24 U/L (ref 15–41)
Albumin: 4.3 g/dL (ref 3.5–5.0)
Alkaline Phosphatase: 56 U/L (ref 38–126)
Anion gap: 11 (ref 5–15)
BUN: 10 mg/dL (ref 6–20)
CO2: 24 mmol/L (ref 22–32)
Calcium: 9.3 mg/dL (ref 8.9–10.3)
Chloride: 106 mmol/L (ref 98–111)
Creatinine, Ser: 0.65 mg/dL (ref 0.44–1.00)
GFR, Estimated: >60 mL/min (ref >60)
Glucose, Bld: 99 mg/dL (ref 70–99)
Potassium: 4.2 mmol/L (ref 3.5–5.1)
Sodium: 141 mmol/L (ref 135–145)
Total Bilirubin: 1 mg/dL (ref 0.3–1.2)
Total Protein: 7.3 g/dL (ref 6.5–8.1)

## 2021-07-16 LAB — CBC WITH DIFFERENTIAL/PLATELET
Abs Immature Granulocytes: 0.02 10*3/uL (ref 0.00–0.07)
Basophils Absolute: 0 10*3/uL (ref 0.0–0.1)
Basophils Relative: 1 %
Eosinophils Absolute: 0.1 10*3/uL (ref 0.0–0.5)
Eosinophils Relative: 1 %
HCT: 36.1 % (ref 36.0–46.0)
Hemoglobin: 11.1 g/dL — ABNORMAL LOW (ref 12.0–15.0)
Immature Granulocytes: 0 %
Lymphocytes Relative: 15 %
Lymphs Abs: 1 10*3/uL (ref 0.7–4.0)
MCH: 27.8 pg (ref 26.0–34.0)
MCHC: 30.7 g/dL (ref 30.0–36.0)
MCV: 90.5 fL (ref 80.0–100.0)
Monocytes Absolute: 0.5 10*3/uL (ref 0.1–1.0)
Monocytes Relative: 8 %
Neutro Abs: 4.9 10*3/uL (ref 1.7–7.7)
Neutrophils Relative %: 75 %
Platelets: 228 10*3/uL (ref 150–400)
RBC: 3.99 MIL/uL (ref 3.87–5.11)
RDW: 16.6 % — ABNORMAL HIGH (ref 11.5–15.5)
WBC: 6.5 10*3/uL (ref 4.0–10.5)
nRBC: 0 % (ref 0.0–0.2)

## 2021-07-16 LAB — HCG, QUANTITATIVE, PREGNANCY: hCG, Beta Chain, Quant, S: <1 m[IU]/mL (ref <5)

## 2021-07-16 MED ORDER — HYDROCODONE-ACETAMINOPHEN 5-325 MG PO TABS: 1.0000 | ORAL_TABLET | Freq: Four times a day (QID) | ORAL | 0 refills | Status: AC | PRN

## 2021-07-16 MED ORDER — FENTANYL CITRATE PF 50 MCG/ML IJ SOSY
50.0000 ug | PREFILLED_SYRINGE | Freq: Once | INTRAMUSCULAR | Status: AC
  Administered 2021-07-16: 50 ug via INTRAVENOUS
  Filled 2021-07-16: qty 1

## 2021-07-16 MED ORDER — MORPHINE SULFATE (PF) 2 MG/ML IV SOLN
2.0000 mg | Freq: Once | INTRAVENOUS | Status: AC
  Administered 2021-07-16: 2 mg via INTRAVENOUS
  Filled 2021-07-16: qty 1
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
## 2021-07-27 ENCOUNTER — Ambulatory Visit: Payer: Medicaid Other | Admitting: Orthopaedic Surgery

## 2021-08-24 ENCOUNTER — Ambulatory Visit: Payer: Medicaid Other | Admitting: Orthopaedic Surgery

## 2022-01-26 ENCOUNTER — Emergency Department (HOSPITAL_COMMUNITY): Payer: Medicaid Other

## 2022-01-26 ENCOUNTER — Emergency Department (HOSPITAL_COMMUNITY)
Admission: EM | Admit: 2022-01-26 | Discharge: 2022-02-07 | Disposition: E | Payer: Medicaid Other | Attending: Emergency Medicine | Admitting: Emergency Medicine

## 2022-01-26 DIAGNOSIS — Y92410 Unspecified street and highway as the place of occurrence of the external cause: Secondary | ICD-10-CM | POA: Diagnosis not present

## 2022-01-26 DIAGNOSIS — S0993XA Unspecified injury of face, initial encounter: Secondary | ICD-10-CM | POA: Diagnosis not present

## 2022-01-26 DIAGNOSIS — I469 Cardiac arrest, cause unspecified: Secondary | ICD-10-CM | POA: Diagnosis not present

## 2022-01-26 DIAGNOSIS — I468 Cardiac arrest due to other underlying condition: Secondary | ICD-10-CM | POA: Diagnosis present

## 2022-01-26 DIAGNOSIS — T1490XA Injury, unspecified, initial encounter: Secondary | ICD-10-CM

## 2022-01-26 LAB — I-STAT VENOUS BLOOD GAS, ED
Acid-base deficit: 22 mmol/L — ABNORMAL HIGH (ref 0.0–2.0)
Bicarbonate: 9.6 mmol/L — ABNORMAL LOW (ref 20.0–28.0)
Calcium, Ion: 0.77 mmol/L — CL (ref 1.15–1.40)
HCT: 29 % — ABNORMAL LOW (ref 36.0–46.0)
Hemoglobin: 9.9 g/dL — ABNORMAL LOW (ref 12.0–15.0)
O2 Saturation: 92 %
Potassium: 4.8 mmol/L (ref 3.5–5.1)
Sodium: 138 mmol/L (ref 135–145)
TCO2: 11 mmol/L — ABNORMAL LOW (ref 22–32)
pCO2, Ven: 48.9 mmHg (ref 44–60)
pH, Ven: 6.902 — CL (ref 7.25–7.43)
pO2, Ven: 108 mmHg — ABNORMAL HIGH (ref 32–45)

## 2022-01-26 LAB — I-STAT CHEM 8, ED
BUN: 6 mg/dL (ref 6–20)
Calcium, Ion: 0.76 mmol/L — CL (ref 1.15–1.40)
Chloride: 104 mmol/L (ref 98–111)
Creatinine, Ser: 1.2 mg/dL — ABNORMAL HIGH (ref 0.44–1.00)
Glucose, Bld: 368 mg/dL — ABNORMAL HIGH (ref 70–99)
HCT: 29 % — ABNORMAL LOW (ref 36.0–46.0)
Hemoglobin: 9.9 g/dL — ABNORMAL LOW (ref 12.0–15.0)
Potassium: 4.8 mmol/L (ref 3.5–5.1)
Sodium: 139 mmol/L (ref 135–145)
TCO2: 13 mmol/L — ABNORMAL LOW (ref 22–32)

## 2022-01-26 LAB — MASSIVE TRANSFUSION PROTOCOL ORDER (BLOOD BANK NOTIFICATION)

## 2022-01-26 SURGERY — Surgical Case
Anesthesia: *Unknown

## 2022-01-26 NOTE — ED Notes (Signed)
Blood Bank called to start MTP. ?

## 2022-01-26 NOTE — Progress Notes (Signed)
Orthopedic Tech Progress Note ?Patient Details:  ?Kristin Bates ?[birth year] ?[id number] ? ?Patient ID: [name], female   DOB: [birth year], 21 y.o.   MRN: [id number] ?Level I: not needed at this time. ? ?[place] ?02-25-22, 11:43 PM ? ?

## 2022-01-26 NOTE — ED Notes (Signed)
Blood Cooler in Room. ?
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
## 2022-01-27 DIAGNOSIS — I468 Cardiac arrest due to other underlying condition: Secondary | ICD-10-CM | POA: Diagnosis present

## 2022-01-27 DIAGNOSIS — S0993XA Unspecified injury of face, initial encounter: Secondary | ICD-10-CM | POA: Diagnosis present

## 2022-01-27 LAB — TYPE AND SCREEN
ABO/RH(D): O POS
Antibody Screen: NEGATIVE
Unit division: 0
Unit division: 0
Unit division: 0
Unit division: 0
Unit division: 0
Unit division: 0
Unit division: 0
Unit division: 0
Unit division: 0
Unit division: 0
Unit division: 0
Unit division: 0
Unit division: 0
Unit division: 0
Unit division: 0
Unit division: 0
Unit division: 0
Unit division: 0
Unit division: 0
Unit division: 0
Unit division: 0
Unit division: 0
Unit division: 0

## 2022-01-27 LAB — BPAM RBC
Blood Product Expiration Date: 202304252359
Blood Product Expiration Date: 202304262359
Blood Product Expiration Date: 202304262359
Blood Product Expiration Date: 202304262359
Blood Product Expiration Date: 202304282359
Blood Product Expiration Date: 202305022359
Blood Product Expiration Date: 202305032359
Blood Product Expiration Date: 202305032359
Blood Product Expiration Date: 202305052359
Blood Product Expiration Date: 202305112359
Blood Product Expiration Date: 202305112359
Blood Product Expiration Date: 202305252359
Blood Product Expiration Date: 202305252359
Blood Product Expiration Date: 202305252359
Blood Product Expiration Date: 202305252359
Blood Product Expiration Date: 202305252359
Blood Product Expiration Date: 202305262359
Blood Product Expiration Date: 202305262359
Blood Product Expiration Date: 202305262359
Blood Product Expiration Date: 202305262359
Blood Product Expiration Date: 202305262359
Blood Product Expiration Date: 202305262359
Blood Product Expiration Date: 202305262359
ISSUE DATE / TIME: 202304192303
ISSUE DATE / TIME: 202304192311
ISSUE DATE / TIME: 202304192318
ISSUE DATE / TIME: 202304192318
ISSUE DATE / TIME: 202304192318
ISSUE DATE / TIME: 202304192318
ISSUE DATE / TIME: 202304192321
ISSUE DATE / TIME: 202304192326
ISSUE DATE / TIME: 202304192326
ISSUE DATE / TIME: 202304192326
ISSUE DATE / TIME: 202304192326
ISSUE DATE / TIME: 202304192335
ISSUE DATE / TIME: 202304192335
ISSUE DATE / TIME: 202304192335
ISSUE DATE / TIME: 202304192335
ISSUE DATE / TIME: 202304192348
ISSUE DATE / TIME: 202304192348
ISSUE DATE / TIME: 202304192348
ISSUE DATE / TIME: 202304192348
ISSUE DATE / TIME: 202304200001
ISSUE DATE / TIME: 202304200001
ISSUE DATE / TIME: 202304200001
ISSUE DATE / TIME: 202304200001
Unit Type and Rh: 5100
Unit Type and Rh: 5100
Unit Type and Rh: 5100
Unit Type and Rh: 5100
Unit Type and Rh: 5100
Unit Type and Rh: 5100
Unit Type and Rh: 5100
Unit Type and Rh: 5100
Unit Type and Rh: 5100
Unit Type and Rh: 5100
Unit Type and Rh: 5100
Unit Type and Rh: 5100
Unit Type and Rh: 9500
Unit Type and Rh: 9500
Unit Type and Rh: 9500
Unit Type and Rh: 9500
Unit Type and Rh: 9500
Unit Type and Rh: 9500
Unit Type and Rh: 9500
Unit Type and Rh: 9500
Unit Type and Rh: 9500
Unit Type and Rh: 9500
Unit Type and Rh: 9500

## 2022-01-27 LAB — BPAM FFP
Blood Product Expiration Date: 202304202359
Blood Product Expiration Date: 202304232359
Blood Product Expiration Date: 202304232359
Blood Product Expiration Date: 202304232359
Blood Product Expiration Date: 202304242359
Blood Product Expiration Date: 202304242359
Blood Product Expiration Date: 202304242359
Blood Product Expiration Date: 202304242359
Blood Product Expiration Date: 202304242359
Blood Product Expiration Date: 202304242359
Blood Product Expiration Date: 202304242359
Blood Product Expiration Date: 202304242359
Blood Product Expiration Date: 202305032359
Blood Product Expiration Date: 202305032359
Blood Product Expiration Date: 202305092359
Blood Product Expiration Date: 202305092359
Blood Product Expiration Date: 202305102359
Blood Product Expiration Date: 202305142359
Blood Product Expiration Date: 202305142359
Blood Product Expiration Date: 202305142359
Blood Product Expiration Date: 202305142359
ISSUE DATE / TIME: 202304192315
ISSUE DATE / TIME: 202304192315
ISSUE DATE / TIME: 202304192318
ISSUE DATE / TIME: 202304192318
ISSUE DATE / TIME: 202304192318
ISSUE DATE / TIME: 202304192318
ISSUE DATE / TIME: 202304192326
ISSUE DATE / TIME: 202304192326
ISSUE DATE / TIME: 202304192326
ISSUE DATE / TIME: 202304192326
ISSUE DATE / TIME: 202304192335
ISSUE DATE / TIME: 202304192335
ISSUE DATE / TIME: 202304192335
ISSUE DATE / TIME: 202304192351
ISSUE DATE / TIME: 202304192351
ISSUE DATE / TIME: 202304192351
ISSUE DATE / TIME: 202304192351
ISSUE DATE / TIME: 202304200007
ISSUE DATE / TIME: 202304200007
ISSUE DATE / TIME: 202304200007
ISSUE DATE / TIME: 202304200007
Unit Type and Rh: 600
Unit Type and Rh: 600
Unit Type and Rh: 6200
Unit Type and Rh: 6200
Unit Type and Rh: 6200
Unit Type and Rh: 6200
Unit Type and Rh: 6200
Unit Type and Rh: 6200
Unit Type and Rh: 6200
Unit Type and Rh: 6200
Unit Type and Rh: 6200
Unit Type and Rh: 6200
Unit Type and Rh: 6200
Unit Type and Rh: 6200
Unit Type and Rh: 6200
Unit Type and Rh: 6200
Unit Type and Rh: 6200
Unit Type and Rh: 7300
Unit Type and Rh: 7300
Unit Type and Rh: 7300
Unit Type and Rh: 7300

## 2022-01-27 LAB — PREPARE FRESH FROZEN PLASMA
Unit division: 0
Unit division: 0
Unit division: 0
Unit division: 0
Unit division: 0
Unit division: 0
Unit division: 0
Unit division: 0
Unit division: 0
Unit division: 0
Unit division: 0
Unit division: 0
Unit division: 0
Unit division: 0
Unit division: 0
Unit division: 0

## 2022-01-27 LAB — COMPREHENSIVE METABOLIC PANEL
ALT: 239 U/L — ABNORMAL HIGH (ref 0–44)
AST: 340 U/L — ABNORMAL HIGH (ref 15–41)
Albumin: 2.4 g/dL — ABNORMAL LOW (ref 3.5–5.0)
Alkaline Phosphatase: 62 U/L (ref 38–126)
Anion gap: 28 — ABNORMAL HIGH (ref 5–15)
BUN: 7 mg/dL (ref 6–20)
CO2: 10 mmol/L — ABNORMAL LOW (ref 22–32)
Calcium: 11.9 mg/dL — ABNORMAL HIGH (ref 8.9–10.3)
Chloride: 104 mmol/L (ref 98–111)
Creatinine, Ser: 1.25 mg/dL — ABNORMAL HIGH (ref 0.44–1.00)
GFR, Estimated: >60 mL/min (ref >60)
Glucose, Bld: 407 mg/dL — ABNORMAL HIGH (ref 70–99)
Potassium: 5.1 mmol/L (ref 3.5–5.1)
Sodium: 142 mmol/L (ref 135–145)
Total Bilirubin: 0.9 mg/dL (ref 0.3–1.2)
Total Protein: 4.3 g/dL — ABNORMAL LOW (ref 6.5–8.1)

## 2022-01-27 LAB — PREPARE PLATELET PHERESIS
Unit division: 0
Unit division: 0

## 2022-01-27 LAB — BPAM CRYOPRECIPITATE
Blood Product Expiration Date: 202304200536
ISSUE DATE / TIME: 202304192355
Unit Type and Rh: 6200

## 2022-01-27 LAB — DIC (DISSEMINATED INTRAVASCULAR COAGULATION)PANEL
D-Dimer, Quant: 12.71 ug/mL-FEU — ABNORMAL HIGH (ref 0.00–0.50)
Fibrinogen: 169 mg/dL — ABNORMAL LOW (ref 210–475)
INR: 1.5 — ABNORMAL HIGH (ref 0.8–1.2)
Platelets: 97 10*3/uL — ABNORMAL LOW (ref 150–400)
Prothrombin Time: 17.7 seconds — ABNORMAL HIGH (ref 11.4–15.2)
Smear Review: NONE SEEN
aPTT: 69 seconds — ABNORMAL HIGH (ref 24–36)

## 2022-01-27 LAB — BPAM PLATELET PHERESIS
Blood Product Expiration Date: 202304212359
Blood Product Expiration Date: 202304222359
ISSUE DATE / TIME: 202304192327
ISSUE DATE / TIME: 202304192349
Unit Type and Rh: 6200
Unit Type and Rh: 7300

## 2022-01-27 LAB — CBC
HCT: 34.7 % — ABNORMAL LOW (ref 36.0–46.0)
Hemoglobin: 10 g/dL — ABNORMAL LOW (ref 12.0–15.0)
MCH: 27.9 pg (ref 26.0–34.0)
MCHC: 28.8 g/dL — ABNORMAL LOW (ref 30.0–36.0)
MCV: 96.7 fL (ref 80.0–100.0)
Platelets: 92 10*3/uL — ABNORMAL LOW (ref 150–400)
RBC: 3.59 MIL/uL — ABNORMAL LOW (ref 3.87–5.11)
RDW: 15.8 % — ABNORMAL HIGH (ref 11.5–15.5)
WBC: 6.1 10*3/uL (ref 4.0–10.5)
nRBC: 4.4 % — ABNORMAL HIGH (ref 0.0–0.2)

## 2022-01-27 LAB — PREPARE CRYOPRECIPITATE: Unit division: 0

## 2022-01-27 LAB — PROTIME-INR
INR: 1.5 — ABNORMAL HIGH (ref 0.8–1.2)
Prothrombin Time: 18.1 seconds — ABNORMAL HIGH (ref 11.4–15.2)

## 2022-01-27 LAB — ETHANOL: Alcohol, Ethyl (B): 231 mg/dL — ABNORMAL HIGH (ref <10)

## 2022-01-27 LAB — LACTIC ACID, PLASMA: Lactic Acid, Venous: >9 mmol/L (ref 0.5–1.9)

## 2022-01-27 LAB — I-STAT BETA HCG BLOOD, ED (MC, WL, AP ONLY): I-stat hCG, quantitative: <5 m[IU]/mL (ref <5)

## 2022-01-27 LAB — ABO/RH: ABO/RH(D): O POS

## 2022-02-07 NOTE — ED Notes (Signed)
 Fentanyl drip wasted with Vonna Kotyk, RN. ?

## 2022-02-07 NOTE — ED Notes (Signed)
[place] called by this Clinical research associate ?

## 2022-02-07 NOTE — ED Notes (Signed)
ME consulted by this writer ?
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
## 2022-02-07 NOTE — Progress Notes (Signed)
Chaplain met with family accompanied by Engineer, structural.   She then met with family again when family was informed she was deceased. ? ?Chaplain gathered info for Patient Placement ? ?The pt's mother shares the same name as her daughter.   She is ALSO Kristin Bates. ? ?The number provided to this chaplain is ? [phone]. ?Kristin Bates (the mother)  lives at [employer]. [place]. ?Kristin Bates speaks ONLY spanish.  ? ?This information was provided by the pt's half-sister, Kristin Bates,.  Whose number is [phone].  The chaplain provided the Patient Placement Card to Kristin Bates.  ? ?The patient's SO, with whom she shared and a child and was living with at the time of death was name Kristin Bates?  He departed before more info was gathered. ? ?The patient was the mother of a 79 yr.old and 43 year old child.  ? ?Kristin Bates ?Pager [phone] ? ?

## 2022-02-07 NOTE — ED Notes (Signed)
Pt arrives to Room. ?No pulse, compressions continued  2301 ?Epi 2301 ?Normal Saline Bolus 2305 ?Epi 2306 ?Bicarb 2306 ?Pulse Check/ No Pulses 2307 ?Resuming Compressions 2307 ?Pulse Check / Negative Fast 2310 ?Epi 2310 ?Resume compressions 2311 ?Calcium 2313 ?Pulse check / Pulses Back 2313 ?Lost Pulses / Compressions started 2318 ?Epi 2319 ?Blood cooler in room 2323 ?EPI DRIP 2323 ?Calcium 2328 ?Calcium 2337 ?Bicarb 2340 ?Nor Epi 2347 ?Transported to CT 2349 ?CPR 2351 ?Epi 2352 ?Pulse check No Pulses Continue CPR 2354 ?Bicarb 2355 ?Epi 2355 ?Pulse Check No Pulses 2357 ?Time of Death 35 ?

## 2022-02-07 NOTE — Procedures (Addendum)
Central line ? ?Date/Time: 01/17/2022 1:07 AM ?Performed by: Fritzi Mandes, MD ?Authorized by: Fritzi Mandes, MD  ? ?Consent:  ?  Consent obtained:  Emergent situation ?Pre-procedure details:  ?  Indication(s): insufficient peripheral access   ?  Skin preparation:  Chlorhexidine ?Procedure details:  ?  Location:  L femoral ?  Procedural supplies:  Cordis ?  Landmarks identified: yes   ?  Ultrasound guidance: no   ?  Number of attempts:  1 ?  Successful placement: yes   ?Post-procedure details:  ?  Post-procedure:  Line sutured ?  Assessment:  Blood return through all ports and free fluid flow ? ?PIV became dislodged and immediate access was needed for rapid infuser. Cordis placed via the left femoral vein, non-sterile due to acuity of situation. Blood return present, rapid infuser immediately connected with good flow. ?
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
## 2022-02-07 DEATH — deceased
